# Patient Record
Sex: Female | Born: 1960 | Race: White | Hispanic: No | Marital: Married | Smoking: Former smoker
Health system: Southern US, Community
[De-identification: ages and names within clinical notes are randomized; demographics above are authoritative.]

## PROBLEM LIST (undated history)

## (undated) DIAGNOSIS — E039 Hypothyroidism, unspecified: Secondary | ICD-10-CM

## (undated) DIAGNOSIS — I10 Essential (primary) hypertension: Secondary | ICD-10-CM

## (undated) HISTORY — DX: Hypothyroidism, unspecified: E03.9

## (undated) HISTORY — DX: Essential (primary) hypertension: I10

---

## 1997-10-23 HISTORY — PX: OOPHORECTOMY: SHX86

## 1997-10-23 HISTORY — PX: CARPAL TUNNEL RELEASE: SHX101

## 2000-05-09 ENCOUNTER — Other Ambulatory Visit: Admission: RE | Admit: 2000-05-09 | Discharge: 2000-05-23 | Payer: Self-pay | Admitting: *Deleted

## 2000-07-16 ENCOUNTER — Ambulatory Visit (HOSPITAL_COMMUNITY): Admission: RE | Admit: 2000-07-16 | Discharge: 2000-07-16 | Payer: Self-pay | Admitting: Gastroenterology

## 2001-04-19 ENCOUNTER — Emergency Department (HOSPITAL_COMMUNITY): Admission: EM | Admit: 2001-04-19 | Discharge: 2001-04-19 | Payer: Self-pay | Admitting: Internal Medicine

## 2001-04-19 ENCOUNTER — Encounter: Payer: Self-pay | Admitting: Emergency Medicine

## 2001-04-19 ENCOUNTER — Emergency Department (HOSPITAL_COMMUNITY): Admission: EM | Admit: 2001-04-19 | Discharge: 2001-04-19 | Payer: Self-pay | Admitting: Emergency Medicine

## 2001-04-22 ENCOUNTER — Ambulatory Visit (HOSPITAL_COMMUNITY): Admission: AD | Admit: 2001-04-22 | Discharge: 2001-04-22 | Payer: Self-pay | Admitting: Urology

## 2001-04-22 ENCOUNTER — Encounter: Payer: Self-pay | Admitting: Urology

## 2005-09-18 ENCOUNTER — Other Ambulatory Visit: Admission: RE | Admit: 2005-09-18 | Discharge: 2005-09-18 | Payer: Self-pay | Admitting: Family Medicine

## 2008-01-15 ENCOUNTER — Other Ambulatory Visit: Admission: RE | Admit: 2008-01-15 | Discharge: 2008-01-15 | Payer: Self-pay | Admitting: Family Medicine

## 2010-07-31 ENCOUNTER — Ambulatory Visit: Payer: Self-pay | Admitting: Interventional Radiology

## 2010-07-31 ENCOUNTER — Observation Stay (HOSPITAL_COMMUNITY): Admission: EM | Admit: 2010-07-31 | Discharge: 2010-08-03 | Payer: Self-pay | Admitting: Internal Medicine

## 2010-08-01 ENCOUNTER — Encounter (INDEPENDENT_AMBULATORY_CARE_PROVIDER_SITE_OTHER): Payer: Self-pay | Admitting: Internal Medicine

## 2010-08-03 ENCOUNTER — Encounter: Payer: Self-pay | Admitting: Cardiology

## 2010-09-02 ENCOUNTER — Inpatient Hospital Stay (HOSPITAL_BASED_OUTPATIENT_CLINIC_OR_DEPARTMENT_OTHER): Admission: RE | Admit: 2010-09-02 | Discharge: 2010-09-02 | Payer: Self-pay | Admitting: Interventional Cardiology

## 2010-10-23 HISTORY — PX: FINGER SURGERY: SHX640

## 2010-10-28 LAB — BASIC METABOLIC PANEL
BUN: 18 mg/dL (ref 6–23)
CO2: 31 mEq/L (ref 19–32)
Calcium: 9.4 mg/dL (ref 8.4–10.5)
Chloride: 104 mEq/L (ref 96–112)
Creatinine, Ser: 1.22 mg/dL — ABNORMAL HIGH (ref 0.4–1.2)
GFR calc Af Amer: 56 mL/min — ABNORMAL LOW (ref >60)
GFR calc non Af Amer: 46 mL/min — ABNORMAL LOW (ref >60)
Glucose, Bld: 99 mg/dL (ref 70–99)
Potassium: 3.9 mEq/L (ref 3.5–5.1)
Sodium: 141 mEq/L (ref 135–145)

## 2010-10-31 ENCOUNTER — Ambulatory Visit
Admission: RE | Admit: 2010-10-31 | Discharge: 2010-10-31 | Payer: Self-pay | Source: Home / Self Care | Attending: Orthopedic Surgery | Admitting: Orthopedic Surgery

## 2010-11-07 LAB — POCT HEMOGLOBIN-HEMACUE: Hemoglobin: 14.9 g/dL (ref 12.0–15.0)

## 2010-11-11 NOTE — Op Note (Signed)
Jessica Valencia, Jessica Valencia               ACCOUNT NO.:  [id number]  MEDICAL RECORD NO.:  [id number]          PATIENT TYPE:  AMB  LOCATION:  DSC                          FACILITY:  [facility]  PHYSICIAN:  Betha Loa, MD        DATE OF BIRTH:  August 08, 1961  DATE OF PROCEDURE:  10/31/2010 DATE OF DISCHARGE:                              OPERATIVE REPORT   PREOPERATIVE DIAGNOSIS:  Left long locked trigger finger.  POSTOPERATIVE DIAGNOSIS:  Left long locked trigger finger.  PROCEDURE:  Left long finger trigger digit release.  SURGEON:  Betha Loa, MD  ASSISTANTS:  None.  ANESTHESIA:  MAC sedation with local anesthesia.  IV FLUIDS:  Per anesthesia flow sheet.  ESTIMATED BLOOD LOSS:  Minimal.  COMPLICATIONS:  None.  SPECIMENS:  None.  TOURNIQUET TIME:  70 minutes.  DISPOSITION:  Stable to PACU.  INDICATIONS:  Jessica Valencia is a 50 year old white female who I have followed in the office for bilateral long finger trigger digit.  We injected both trigger digits two times.  She returned to the office last week with her left long finger in a flexed position.  She states she is unable to extend it.  She had a painful and palpable flexor tendon nodule.  She wished to have a surgical release of the trigger digit. Risks, benefits, and alternatives of surgery were discussed including the risk of blood loss, infection, damage to nerves, vessels, tendons, ligaments, bone, failure of surgery, need for additional surgery, complications with wound healing, continued pain, and continued triggering.  She voiced understanding of risks and elected to proceed.  OPERATIVE COURSE:  After being identified preoperatively by myself, the patient and I agreed upon the procedure and site of procedure.  The surgical site was marked.  The risks, benefits, and alternatives of surgery were reviewed and she wished to proceed.  Surgical consent had been signed.  She was given 1 g of IV Ancef as a preoperative  antibiotic prophylaxis.  She was transported to the operating room and placed on the operating table in supine position with left upper extremity on armboard.  Sedation was induced by the anesthesia staff.  Local anesthesia was injected into the palm over the A1 pulley of the left long finger using 0.25% plain Marcaine, 8 mL total was used.  A surgical pause was performed between surgeons, Anesthesia, operating staff, and all were in agreement as to the patient, procedure, and site of procedure prior to the injection.  The long finger was then pulled into extension.  There was a sharp triggering.  It was difficult to extend. The left upper extremity was then prepped and draped in normal sterile orthopedic fashion using ChloraPrep.  Tourniquet at the proximal aspect of the extremity was inflated to 250 mmHg after exsanguination of the limb with an Esmarch bandage.  An incision was made over the A1 pulley of the left long finger between the proximal and distal palmar creases. This was carried into the subcutaneous tissues by spreading technique. Care was taken to protect all neurovascular structures.  The A1 pulley was identified.  It was sharply incised using the  knife.  There was noted to be a very large flexor tendon nodule.  The A1 pulley was incised in its entirety distally.  It was incised proximally.  There was a small amount of A0 pulley.  Some of the palmar fascia was released as well to prevent the nodule from triggering on that.  The finger was placed through full range of motion, it was not noted to trigger.  The patient was awoken from her sedation and the drape lowered so she could see her finger.  She placed her hand into a tight fist and then extended her fingers and noted no triggering.  I felt no triggering.  The drape was then re-raised.  The wound was copiously irrigated with 500 mL of sterile saline.  It was closed using 4-0 nylon in a horizontal mattress fashion.  The  wound was dressed with sterile Xeroform, 4 x 4's, and wrapped with Kling and an Ace bandage.  Tourniquet was deflated at 17 minutes.  The fingertips were pink with brisk capillary refill after deflation of the tourniquet.  The operative drapings were broken down. The patient was awoken from sedation safely.  She was transferred back to the stretcher and taken to the PACU in stable condition.  I will see her back in the office in 1 week for postoperative follow up.  I will give Percocet 5/325 one to two p.o. q.6 h. p.r.n. pain, dispensed #40.     Betha Loa, MD     KK/MEDQ  D:  10/31/2010  T:  10/31/2010  Job:  [id number]  Electronically Signed by Betha Loa  on 11/11/2010 03:59:22 PM

## 2011-01-04 LAB — CARDIAC PANEL(CRET KIN+CKTOT+MB+TROPI)
CK, MB: 1 ng/mL (ref 0.3–4.0)
CK, MB: 1.2 ng/mL (ref 0.3–4.0)
CK, MB: 1.3 ng/mL (ref 0.3–4.0)
Relative Index: INVALID (ref 0.0–2.5)
Relative Index: INVALID (ref 0.0–2.5)
Relative Index: INVALID (ref 0.0–2.5)
Total CK: 51 U/L (ref 7–177)
Total CK: 51 U/L (ref 7–177)
Total CK: 52 U/L (ref 7–177)
Troponin I: 0.01 ng/mL (ref 0.00–0.06)
Troponin I: 0.01 ng/mL (ref 0.00–0.06)
Troponin I: 0.01 ng/mL (ref 0.00–0.06)

## 2011-01-04 LAB — LIPID PANEL
Cholesterol: 191 mg/dL (ref 0–200)
HDL: 37 mg/dL — ABNORMAL LOW (ref >39)
LDL Cholesterol: 119 mg/dL — ABNORMAL HIGH (ref 0–99)
Total CHOL/HDL Ratio: 5.2 RATIO
Triglycerides: 176 mg/dL — ABNORMAL HIGH (ref <150)
VLDL: 35 mg/dL (ref 0–40)

## 2011-01-04 LAB — BASIC METABOLIC PANEL
BUN: 14 mg/dL (ref 6–23)
CO2: 26 mEq/L (ref 19–32)
Calcium: 8.7 mg/dL (ref 8.4–10.5)
Chloride: 108 mEq/L (ref 96–112)
Creatinine, Ser: 0.9 mg/dL (ref 0.4–1.2)
GFR calc Af Amer: >60 mL/min (ref >60)
GFR calc non Af Amer: >60 mL/min (ref >60)
Glucose, Bld: 101 mg/dL — ABNORMAL HIGH (ref 70–99)
Potassium: 3.2 mEq/L — ABNORMAL LOW (ref 3.5–5.1)
Sodium: 140 mEq/L (ref 135–145)

## 2011-01-04 LAB — HEMOGLOBIN A1C
Hgb A1c MFr Bld: 4.9 % (ref <5.7)
Mean Plasma Glucose: 94 mg/dL (ref <117)

## 2011-01-04 LAB — COMPREHENSIVE METABOLIC PANEL
ALT: 20 U/L (ref 0–35)
AST: 16 U/L (ref 0–37)
Albumin: 3.5 g/dL (ref 3.5–5.2)
Alkaline Phosphatase: 47 U/L (ref 39–117)
BUN: 20 mg/dL (ref 6–23)
CO2: 28 mEq/L (ref 19–32)
Calcium: 9.7 mg/dL (ref 8.4–10.5)
Chloride: 108 mEq/L (ref 96–112)
Creatinine, Ser: 0.92 mg/dL (ref 0.4–1.2)
GFR calc Af Amer: >60 mL/min (ref >60)
GFR calc non Af Amer: >60 mL/min (ref >60)
Glucose, Bld: 109 mg/dL — ABNORMAL HIGH (ref 70–99)
Potassium: 4.1 mEq/L (ref 3.5–5.1)
Sodium: 141 mEq/L (ref 135–145)
Total Bilirubin: 0.4 mg/dL (ref 0.3–1.2)
Total Protein: 6.1 g/dL (ref 6.0–8.3)

## 2011-01-04 LAB — CBC
HCT: 38.8 % (ref 36.0–46.0)
HCT: 40 % (ref 36.0–46.0)
Hemoglobin: 13.5 g/dL (ref 12.0–15.0)
Hemoglobin: 14 g/dL (ref 12.0–15.0)
MCH: 30.1 pg (ref 26.0–34.0)
MCH: 30.3 pg (ref 26.0–34.0)
MCHC: 34.8 g/dL (ref 30.0–36.0)
MCHC: 35 g/dL (ref 30.0–36.0)
MCV: 86.1 fL (ref 78.0–100.0)
MCV: 87.1 fL (ref 78.0–100.0)
Platelets: 196 10*3/uL (ref 150–400)
Platelets: 201 10*3/uL (ref 150–400)
RBC: 4.45 MIL/uL (ref 3.87–5.11)
RBC: 4.64 MIL/uL (ref 3.87–5.11)
RDW: 14.1 % (ref 11.5–15.5)
RDW: 14.3 % (ref 11.5–15.5)
WBC: 6.3 10*3/uL (ref 4.0–10.5)
WBC: 6.4 10*3/uL (ref 4.0–10.5)

## 2011-01-04 LAB — TSH: TSH: 0.665 u[IU]/mL (ref 0.350–4.500)

## 2011-01-04 LAB — D-DIMER, QUANTITATIVE: D-Dimer, Quant: <0.22 ug/mL-FEU (ref 0.00–0.48)

## 2011-01-05 LAB — POCT CARDIAC MARKERS
CKMB, poc: <1 ng/mL — ABNORMAL LOW (ref 1.0–8.0)
CKMB, poc: <1 ng/mL — ABNORMAL LOW (ref 1.0–8.0)
Myoglobin, poc: 43.2 ng/mL (ref 12–200)
Myoglobin, poc: 67.1 ng/mL (ref 12–200)
Troponin i, poc: <0.05 ng/mL (ref 0.00–0.09)
Troponin i, poc: <0.05 ng/mL (ref 0.00–0.09)

## 2011-01-05 LAB — CBC
HCT: 45.9 % (ref 36.0–46.0)
Hemoglobin: 15.5 g/dL — ABNORMAL HIGH (ref 12.0–15.0)
MCH: 29.8 pg (ref 26.0–34.0)
MCHC: 33.8 g/dL (ref 30.0–36.0)
MCV: 88.4 fL (ref 78.0–100.0)
Platelets: 225 10*3/uL (ref 150–400)
RBC: 5.2 MIL/uL — ABNORMAL HIGH (ref 3.87–5.11)
RDW: 13.2 % (ref 11.5–15.5)
WBC: 7.8 10*3/uL (ref 4.0–10.5)

## 2011-01-05 LAB — DIFFERENTIAL
Basophils Absolute: 0.1 10*3/uL (ref 0.0–0.1)
Basophils Relative: 1 % (ref 0–1)
Eosinophils Absolute: 0.3 10*3/uL (ref 0.0–0.7)
Eosinophils Relative: 4 % (ref 0–5)
Lymphocytes Relative: 29 % (ref 12–46)
Lymphs Abs: 2.2 10*3/uL (ref 0.7–4.0)
Monocytes Absolute: 0.5 10*3/uL (ref 0.1–1.0)
Monocytes Relative: 7 % (ref 3–12)
Neutro Abs: 4.7 10*3/uL (ref 1.7–7.7)
Neutrophils Relative %: 60 % (ref 43–77)

## 2011-01-05 LAB — BASIC METABOLIC PANEL
BUN: 15 mg/dL (ref 6–23)
CO2: 30 mEq/L (ref 19–32)
Calcium: 9.5 mg/dL (ref 8.4–10.5)
Chloride: 105 mEq/L (ref 96–112)
Creatinine, Ser: 0.8 mg/dL (ref 0.4–1.2)
GFR calc Af Amer: >60 mL/min (ref >60)
GFR calc non Af Amer: >60 mL/min (ref >60)
Glucose, Bld: 83 mg/dL (ref 70–99)
Potassium: 3.2 mEq/L — ABNORMAL LOW (ref 3.5–5.1)
Sodium: 142 mEq/L (ref 135–145)

## 2011-03-10 NOTE — Op Note (Signed)
[facility]  Patient:    TAMEIA, RAFFERTY                      MRN: [id number] Proc. Date: 04/22/01 Adm. Date:  [id number] Attending:  Evlyn Clines CC:         Abigail Butts, M.D., [facility]                           Operative Report  PREOPERATIVE DIAGNOSIS:  Left proximal ureteral stone.  POSTOPERATIVE DIAGNOSIS:  Left proximal ureteral stone.  PROCEDURE PERFORMED: 1. Cystoscopy. 2. Left retrograde pyelogram with interpretation. 3. Basket extraction of left ureteral stone. 4. Insertion of left JJ stent.  SURGEON:  Excell Seltzer. Annabell Howells, M.D.  ANESTHESIA:  General.  DRAINS:  Left 6 French x 24 cm JJ stent.  COMPLICATIONS:  None.  SPECIMENS:  Stone.  INDICATIONS:  Ms. Maxham is a 50 year old white female with a 3-4 mm left proximal ureteral stone, who has had pain despite analgesics since being seen in the emergency room on Friday or Saturday.  She has elected to proceed with ureteroscopic stone extraction.  DESCRIPTION OF PROCEDURE:  The patient was given p.o. Tequin.  She was taken to the operating room where a general anesthetic was induced.  She was placed in the lithotomy position.  Her perineum and genitalia were prepped with Betadine solution and she was draped in the usual sterile fashion.  Cystoscopy was performed using the 22 [place] scope and a 12 degree lens.  Examination revealed a normal urethra.  The bladder wall was unremarkable.  The ureteral orifices were in their normal anatomic position.  The left ureteral orifice was cannulated with a 5 [place] open-ended catheter.  Contrast was instilled in a retrograde fashion.  This demonstrated a small filling defect in the proximal ureter about L3 consistent with a stone noted on prior films.  The 5 [place] open-ended catheter was then removed and a Pfister-Schwartz 11 mm stone basket with a filiform tip was passed by the stone, the basket was opened, the stone was engaged,  and was removed without difficulty.  There was a little bit of mucosal tear at the ureteral meatus and some bleeding.  A repeat retrograde following the procedure revealed a little bit of filling defect at the prior site of the stone likely from inflammation, but there was no extravasation or other evidence of ureteral tear.  At this point, a guidewire was passed to the kidney, and a 6 [place], 24 cm JJ stent was passed without difficulty under fluoroscopic guidance.  The guidewire was removed leaving a good coil in the kidney and a good coil in the bladder.  The cystoscope was removed after draining the bladder, leaving the stent string exiting from the urethra.  The string was tied and cut short to an appropriate length.  The patient was taken down from the lithotomy position.  her anesthetic was reversed and she was moved to the recovery room in stable condition.  There were no complications at the end of the procedure. D:  04/22/01 TD:  04/23/01 Job: [id number]

## 2011-03-10 NOTE — Procedures (Signed)
[facility]  Patient:    Jessica Valencia, Jessica Valencia                      MRN: [id number] Proc. Date: 07/16/00 Adm. Date:  [id number] Attending:  Nelda Marseille CC:         Marinda Elk, M.D.   Procedure Report  PROCEDURE:  Colonoscopy.  INDICATIONS:  Family history of colon polyps, guaiac positivity.  INFORMED CONSENT:  Consent was signed after risk, benefits, methods and options were thoroughly discussed in the office.  MEDICINES USED:  Demerol 70 mg, Versed 5 mg.  DESCRIPTION OF PROCEDURE:  Rectal inspection is pertinent for external hemorrhoids.  Digital examination was negative.  The pediatric video colonoscope was inserted, fairly easily advanced around the ______ into the the cecum.  This did require rolling her on her back and some abdominal pressure.  The cecum was identified by the appendiceal orifice and the ileocecal valve.  In fact the scope was inserted a short ways in the terminal ileum which was normal.  Further augmentation was obtained.  The scope was slowly withdrawn.  The prep was adequate.  There was some liquid stool and that required washing and suctioning on the right side, stool that was adherent to the wall, but no obvious polypoid lesions, masses, or other abnormalities.  There was no sign of bleeding as we slowly withdrew back to the rectum.  Once back in the rectum the scope was in retroflex pertinent for some internal hemorrhoids.  The scope was straightened, readvanced a short ways up the sigmoid.  Air was suction.  The scope removed.  The patient tolerated the procedure well and there was no obvious immediate complications.   ENDOSCOPIC DIAGNOSIS: 1. Internal and external hemorrhoids. 2. Otherwise within normal limits to the terminal ileum.  PLAN:  Yearly rectals and guaiacs per Dr. Foy Guadalajara.  Consideration repeat screening p.r.n. or at age 69, and continue work-up for now for an EGD. Please see that dictation for  details. DD:  07/16/00 TD:  07/17/00 Job: [id number]

## 2011-03-10 NOTE — Procedures (Signed)
[facility]  Patient:    Jessica Valencia, Jessica Valencia                      MRN: [id number] Proc. Date: 07/16/00 Adm. Date:  [id number] Attending:  Nelda Marseille CC:         Ranae Plumber, M.D.   Procedure Report  PROCEDURE:  Esophagogastroduodenoscopy.  INDICATIONS FOR PROCEDURE:  A patient with guaiac positivity, nondiagnostic colonoscopy, upper tract symptoms.  Consent was signed after risks, benefits, methods, and options were thoroughly discussed in the office and before any premeds given.  ADDITIONAL MEDICINES FOR THIS PROCEDURE:  Demerol 10, Versed 1.  DESCRIPTION OF PROCEDURE:  The video endoscope was inserted by direct vision. The esophagus was normal. In the distal esophagus, a small hiatal hernia was seen. The scope was inserted into the stomach and advanced through a normal pylorus into a normal duodenal bulb and around the C loop to a normal second portion of the duodenum. The scope was withdrawn back to the bulb and good look there ruled out ulcers in that location. The scope was withdrawn back to the stomach and retroflexed. High in the cardia, the hiatal hernia was confirmed. The fundus, angularis, lesser and greater curve were normal on retroflexed visualization. The scope was straightened. A good look at the antrum, lesser and greater curve on slow withdrawal was pertinent for some minimal gastritis and mild antritis but no other abnormalities. Air was suctioned, the scope slowly withdrawn. Again, a good look at the esophagus was normal without signs of Barretts or significant esophagitis. The scope was removed. The patient tolerated the procedure well. There was no obvious or immediate complication.  ENDOSCOPIC DIAGNOSIS: 1. Small hiatal hernia. 2. Minimal gastritis and antritis. 3. Otherwise normal esophagogastroduodenoscopy.  PLAN:  Continue pump inhibitors. Follow-up p.r.n. or in 6 weeks to recheck symptoms, guaiacs and make sure no  further workup plans are needed. DD:  07/16/00 TD:  07/17/00 Job: [id number]

## 2013-03-14 ENCOUNTER — Other Ambulatory Visit: Payer: Self-pay

## 2013-03-14 DIAGNOSIS — Z1231 Encounter for screening mammogram for malignant neoplasm of breast: Secondary | ICD-10-CM

## 2013-08-07 ENCOUNTER — Ambulatory Visit
Admission: RE | Admit: 2013-08-07 | Discharge: 2013-08-07 | Disposition: A | Discharge status: Home / Self Care | Payer: Self-pay | Source: Ambulatory Visit

## 2013-08-07 DIAGNOSIS — Z1231 Encounter for screening mammogram for malignant neoplasm of breast: Secondary | ICD-10-CM

## 2014-08-12 ENCOUNTER — Other Ambulatory Visit: Payer: Self-pay | Admitting: Family Medicine

## 2014-08-12 DIAGNOSIS — Z1239 Encounter for other screening for malignant neoplasm of breast: Secondary | ICD-10-CM

## 2014-08-14 ENCOUNTER — Ambulatory Visit (HOSPITAL_BASED_OUTPATIENT_CLINIC_OR_DEPARTMENT_OTHER)
Admission: RE | Admit: 2014-08-14 | Discharge: 2014-08-14 | Disposition: A | Discharge status: Home / Self Care | Payer: 59 | Source: Ambulatory Visit | Attending: Family Medicine | Admitting: Family Medicine

## 2014-08-14 DIAGNOSIS — Z1239 Encounter for other screening for malignant neoplasm of breast: Secondary | ICD-10-CM | POA: Insufficient documentation

## 2015-07-14 ENCOUNTER — Other Ambulatory Visit: Payer: Self-pay | Admitting: Family Medicine

## 2015-07-14 ENCOUNTER — Other Ambulatory Visit (HOSPITAL_BASED_OUTPATIENT_CLINIC_OR_DEPARTMENT_OTHER): Payer: Self-pay | Admitting: Family Medicine

## 2015-07-14 DIAGNOSIS — Z1231 Encounter for screening mammogram for malignant neoplasm of breast: Secondary | ICD-10-CM

## 2015-08-19 ENCOUNTER — Ambulatory Visit: Payer: 59

## 2015-08-27 ENCOUNTER — Ambulatory Visit (HOSPITAL_BASED_OUTPATIENT_CLINIC_OR_DEPARTMENT_OTHER): Payer: 59

## 2016-10-27 ENCOUNTER — Other Ambulatory Visit (HOSPITAL_BASED_OUTPATIENT_CLINIC_OR_DEPARTMENT_OTHER): Payer: Self-pay | Admitting: Physician Assistant

## 2016-10-27 DIAGNOSIS — Z1231 Encounter for screening mammogram for malignant neoplasm of breast: Secondary | ICD-10-CM

## 2016-11-03 ENCOUNTER — Ambulatory Visit (HOSPITAL_BASED_OUTPATIENT_CLINIC_OR_DEPARTMENT_OTHER)
Admission: RE | Admit: 2016-11-03 | Discharge: 2016-11-03 | Disposition: A | Discharge status: Home / Self Care | Payer: 59 | Source: Ambulatory Visit | Attending: Physician Assistant | Admitting: Physician Assistant

## 2016-11-03 DIAGNOSIS — Z1231 Encounter for screening mammogram for malignant neoplasm of breast: Secondary | ICD-10-CM | POA: Diagnosis not present

## 2016-12-28 DIAGNOSIS — N393 Stress incontinence (female) (male): Secondary | ICD-10-CM | POA: Insufficient documentation

## 2017-09-04 ENCOUNTER — Ambulatory Visit (INDEPENDENT_AMBULATORY_CARE_PROVIDER_SITE_OTHER): Payer: 59 | Admitting: Podiatry

## 2017-09-04 ENCOUNTER — Ambulatory Visit (INDEPENDENT_AMBULATORY_CARE_PROVIDER_SITE_OTHER): Payer: 59

## 2017-09-04 ENCOUNTER — Telehealth: Payer: Self-pay | Admitting: *Deleted

## 2017-09-04 ENCOUNTER — Encounter: Payer: Self-pay | Admitting: Podiatry

## 2017-09-04 VITALS — BP 143/90 | HR 86 | Resp 16

## 2017-09-04 DIAGNOSIS — M779 Enthesopathy, unspecified: Secondary | ICD-10-CM

## 2017-09-04 DIAGNOSIS — M7751 Other enthesopathy of right foot: Secondary | ICD-10-CM

## 2017-09-04 DIAGNOSIS — M792 Neuralgia and neuritis, unspecified: Secondary | ICD-10-CM

## 2017-09-04 DIAGNOSIS — G5791 Unspecified mononeuropathy of right lower limb: Secondary | ICD-10-CM | POA: Diagnosis not present

## 2017-09-04 DIAGNOSIS — M778 Other enthesopathies, not elsewhere classified: Secondary | ICD-10-CM

## 2017-09-04 NOTE — Progress Notes (Signed)
Subjective:  Patient ID: Jessica Valencia, female    DOB: 06/09/1961,  MRN: [id number] HPI Chief Complaint  Patient presents with  . Foot Pain    Dorsal/lateral right - patient states she had a work injury where a coworker dropped a roll of fabric on her foot 2 years ago, dx with severe bone bruise, was put in boot for several weeks, 6 weeks ago started hurting again, swelling, uses heating pad, takes meloxicam or ibuprofen    56 y.o. female presents with the above complaint.   She states that she can barely stand anything to touch this and the throbbing pain is getting to be more than she can stand.  Past Medical History:  Diagnosis Date  . HTN (hypertension)   . Hypothyroid    Past Surgical History:  Procedure Laterality Date  . CARPAL TUNNEL RELEASE  1999   Right Carpal Tunnel   . FINGER SURGERY  10/2010   Left trigger finger surgery Dr Betha LoaKevin Kuzma   . OOPHORECTOMY  1999   Right Ovary Removed     Current Outpatient Medications:  .  meloxicam (MOBIC) 7.5 MG tablet, Take 7.5 mg daily by mouth., Disp: , Rfl:  .  ALPRAZolam (XANAX) 0.25 MG tablet, Take 0.25 mg by mouth at bedtime as needed for anxiety. Xanax 0.25 MG Tablet 1 tablet daily as needed, Disp: , Rfl:  .  B Complex Vitamins (B COMPLEX PO), Take by mouth., Disp: , Rfl:  .  DOCOSAHEXAENOIC ACID PO, Take by mouth., Disp: , Rfl:  .  eszopiclone (LUNESTA) 2 MG TABS tablet, , Disp: , Rfl: 0 .  FLUARIX QUADRIVALENT 0.5 ML injection, , Disp: , Rfl: 0 .  hydrochlorothiazide (MICROZIDE) 12.5 MG capsule, Take 12.5 mg by mouth daily. Hydrochlorothiazide 12.5 MG Capsule TAKE ONE CAPSULE BY MOUTH EVERY DAY, Disp: , Rfl:  .  ibuprofen (ADVIL,MOTRIN) 800 MG tablet, , Disp: , Rfl: 1 .  levothyroxine (SYNTHROID, LEVOTHROID) 112 MCG tablet, Take 112 mcg by mouth as directed. Levothyroxine Sodium 112 MCG Tablet TAKE 1 TABLET BY MOUTH EVERY DAY 6 Days a week. None on Sunday, Disp: , Rfl:  .  lisinopril (PRINIVIL,ZESTRIL) 10 MG tablet, Take 10  mg by mouth daily. Lisinopril 10 MG Tablet TAKE 1 TABLET BY MOUTH EVERY DAY, Disp: , Rfl:  .  omeprazole (PRILOSEC) 40 MG capsule, Take 40 mg by mouth daily. Omeprazole 40 MG Capsule Delayed Release 1 capsule Once a day, Disp: , Rfl:  .  ondansetron (ZOFRAN) 4 MG tablet, Take 4 mg by mouth every 8 (eight) hours as needed for nausea or vomiting. Zofran 4 MG Tablet 1 tablets three times a day as needed for nausea, Disp: , Rfl:  .  TRAZODONE HCL PO, Take by mouth. Trazodone HCl 50 MG Tablet 1-2 tablets at bedtime, Disp: , Rfl:   Allergies  Allergen Reactions  . Phenergan [Promethazine Hcl]     Phenergan 25 mg tabs: loopy: Side Effects   Review of Systems  Musculoskeletal: Positive for gait problem.  Psychiatric/Behavioral: The patient is nervous/anxious.   All other systems reviewed and are negative.  Objective:   Vitals:   09/04/17 0947  BP: (!) 143/90  Pulse: 86  Resp: 16    General: Well developed, nourished, in no acute distress, alert and oriented x3   Dermatological: Skin is warm, dry and supple bilateral. Nails x 10 are well maintained; remaining integument appears unremarkable at this time. There are no open sores, no preulcerative lesions, no rash or  signs of infection present.  Vascular: Dorsalis Pedis artery and Posterior Tibial artery pedal pulses are 2/4 bilateral with immedate capillary fill time. Pedal hair growth present. No varicosities and no lower extremity edema present bilateral.   Neruologic: Grossly intact via light touch bilateral. Vibratory intact via tuning fork bilateral. Protective threshold with Semmes Wienstein monofilament intact to all pedal sites bilateral. Patellar and Achilles deep tendon reflexes 2+ bilateral. No Babinski or clonus noted bilateral.   Musculoskeletal: No gross boney pedal deformities bilateral. No pain, crepitus, or limitation noted with foot and ankle range of motion bilateral. Muscular strength 5/5 in all groups tested bilateral. She  has pain on palpation overlying the dorsal lateral aspect of the right foot particularly extensor digitorum brevis area. She has pain on range of motion of all of the lesser metatarsals right foot. Abduction and plantar flexion with abduction is very painful but the tendons do appear to be intact. There is not a lot of edema no ecchymosis. No signs of infection.  Gait: Antalgic gait right   Radiographs:  Radiographs taken today 3 views right foot demonstrates an osseously mature foot no fractures are identified there does appear to be in appearance at the base of the third and fourth metatarsals of the right foot though this does not appear to be a fracture currently nor does it appear to have been a fracture.  Assessment & Plan:   Assessment: Cannot rule out a stress injury to the right foot overlying the dorsal lateral midfoot. She also has symptoms of neuritis.  Plan: Since this has been a recurrent problem and symptoms are not relieved with typical anti-inflammatories and therapies MRI will be necessary. I will place her in a compression anklet which she states does feel much better which lends to more of a neurological diagnosis. Requesting MRI as soon as possible. I also started her on methylprednisolone. She will notify me if this works and we will cancel MRI.     Max T. DubachHyatt, [place] Dakota[place]PM

## 2017-09-04 NOTE — Telephone Encounter (Signed)
-----   Message from Kristian Coveyshley E Prevette, [facility] sent at 09/04/2017  1:42 PM EST ----- Regarding: MRI MRI foot and rearfoot right - trauma 2 years ago with long term treatment, possible lisfranc's injury (midfoot pain) right - surgical consideration

## 2017-09-04 NOTE — Telephone Encounter (Signed)
Orders to [facility] for pre-cert and faxed to  Imaging. 

## 2017-09-05 ENCOUNTER — Telehealth: Payer: Self-pay | Admitting: Podiatry

## 2017-09-05 NOTE — Telephone Encounter (Signed)
I was in to see Dr. Al CorpusHyatt yesterday and he was supposed to send a six day steroid prescription to the [facility] in [place]. As of last night, they had not received anything. If you could please help me out with that.

## 2017-09-06 ENCOUNTER — Other Ambulatory Visit: Payer: Self-pay | Admitting: Podiatry

## 2017-09-06 MED ORDER — METHYLPREDNISOLONE 4 MG PO TBPK: ORAL_TABLET | ORAL | 0 refills | Status: DC

## 2017-09-06 NOTE — Telephone Encounter (Signed)
Inform patient that I sent it over.

## 2017-09-06 NOTE — Telephone Encounter (Signed)
My name is Trenda MootsBeverly Terpstra and I was in to see Dr. Al CorpusHyatt yesterday. He was supposedly going to send me a prescription to the [facility] in [place] for steroids. They claim they don't have them yet. My number is [phone]. Thanks.

## 2017-09-06 NOTE — Telephone Encounter (Signed)
Yes, my name is Jessica Valencia and I have made two calls to you and nobody has returned either call. If you can have someone return my call at [phone] I would really appreciate that. Thank you.

## 2017-09-16 ENCOUNTER — Ambulatory Visit
Admission: RE | Admit: 2017-09-16 | Discharge: 2017-09-16 | Disposition: A | Discharge status: Home / Self Care | Payer: 59 | Source: Ambulatory Visit | Attending: Podiatry | Admitting: Podiatry

## 2017-09-16 DIAGNOSIS — M792 Neuralgia and neuritis, unspecified: Secondary | ICD-10-CM

## 2017-09-16 DIAGNOSIS — M779 Enthesopathy, unspecified: Secondary | ICD-10-CM

## 2017-09-27 ENCOUNTER — Encounter: Payer: Self-pay | Admitting: Podiatry

## 2017-09-27 ENCOUNTER — Ambulatory Visit (INDEPENDENT_AMBULATORY_CARE_PROVIDER_SITE_OTHER): Payer: 59 | Admitting: Podiatry

## 2017-09-27 DIAGNOSIS — M778 Other enthesopathies, not elsewhere classified: Secondary | ICD-10-CM

## 2017-09-27 DIAGNOSIS — M7751 Other enthesopathy of right foot: Secondary | ICD-10-CM

## 2017-09-27 DIAGNOSIS — G5791 Unspecified mononeuropathy of right lower limb: Secondary | ICD-10-CM

## 2017-09-27 DIAGNOSIS — M779 Enthesopathy, unspecified: Secondary | ICD-10-CM

## 2017-09-27 NOTE — Progress Notes (Signed)
She presents today for follow-up of her MRI stating that her right foot is still just as painful as it was before.  Objective: Vital signs are stable she is alert and oriented x3.  Pulses are palpable.  She has pain with palpation to Lisfranc's joints of the right foot.  She has pain on frontal plane range of motion.  MRI does demonstrate osteoarthritis of the midfoot as well as posterior tibial tendinitis which is asymptomatic on her.  Assessment: Osteoarthritis Lisfranc's joints right foot.  Capsulitis right foot.  Plan: Discussed etiology pathology conservative versus surgical therapies.  After thorough discussion and consent and sterile Betadine skin prep we injected her across the dorsal aspect of the foot a total of 40 mg of Kenalog was utilized with 10 mg of Marcaine.  I will follow-up with her in 6 weeks if necessary.

## 2018-01-04 ENCOUNTER — Other Ambulatory Visit (HOSPITAL_BASED_OUTPATIENT_CLINIC_OR_DEPARTMENT_OTHER): Payer: Self-pay | Admitting: Internal Medicine

## 2018-01-04 DIAGNOSIS — Z1231 Encounter for screening mammogram for malignant neoplasm of breast: Secondary | ICD-10-CM

## 2018-01-05 ENCOUNTER — Encounter (HOSPITAL_BASED_OUTPATIENT_CLINIC_OR_DEPARTMENT_OTHER): Payer: Self-pay

## 2018-01-05 ENCOUNTER — Ambulatory Visit (HOSPITAL_BASED_OUTPATIENT_CLINIC_OR_DEPARTMENT_OTHER)
Admission: RE | Admit: 2018-01-05 | Discharge: 2018-01-05 | Disposition: A | Discharge status: Home / Self Care | Payer: 59 | Source: Ambulatory Visit | Attending: Internal Medicine | Admitting: Internal Medicine

## 2018-01-05 DIAGNOSIS — Z1231 Encounter for screening mammogram for malignant neoplasm of breast: Secondary | ICD-10-CM | POA: Diagnosis present

## 2018-06-07 ENCOUNTER — Telehealth: Payer: Self-pay | Admitting: Podiatry

## 2018-06-07 NOTE — Telephone Encounter (Signed)
I saw Dr. Al CorpusHyatt on 04 September 2017. I need to know the name of the shot that I was given in my foot. If you could return my call I would really appreciate it. My number is [phone]. Thank you.

## 2018-06-10 NOTE — Telephone Encounter (Signed)
Left message informing pt she received an injection on 09/27/2018 of kenalog a antiinflammatory steroid and marcaine a numbing medication.

## 2018-06-27 ENCOUNTER — Other Ambulatory Visit (HOSPITAL_BASED_OUTPATIENT_CLINIC_OR_DEPARTMENT_OTHER): Payer: Self-pay | Admitting: Adult Health Nurse Practitioner

## 2018-06-27 DIAGNOSIS — R55 Syncope and collapse: Secondary | ICD-10-CM

## 2018-08-26 IMAGING — MR MR FOOT*R* W/O CM
4 of 6 series · 19 of 40 positions shown · non-contrast
Comparison: None.

CLINICAL DATA: Right foot pain and swelling for 2 years. The
patient dropped a roll of fabric on her the right foot 2 years ago.
Subsequent encounter.

EXAM:
MRI OF THE RIGHT FOREFOOT WITHOUT CONTRAST
TECHNIQUE: Multiplanar, multisequence MR imaging of the plain films right foot
10/24/2013. Was performed. No intravenous contrast was administered.

[Series 4: T2 fat-sat · coronal · 4.0mm · 0.23mm/px · 7 of 34 slices shown (1 of 3)]
[im 1/34]
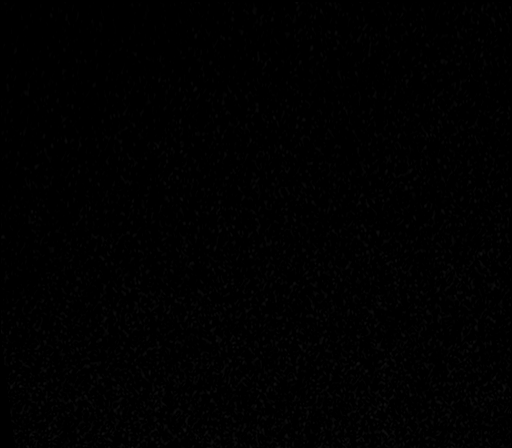
[im 6/34]
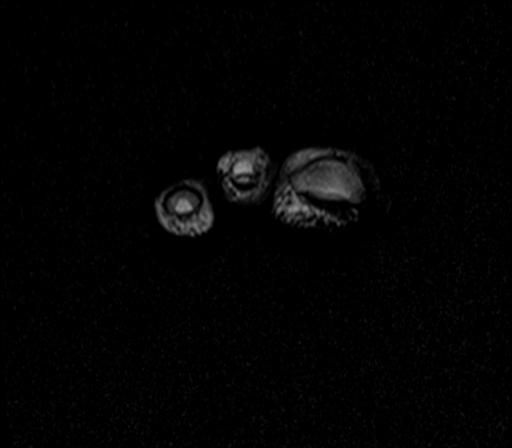
[im 12/34]
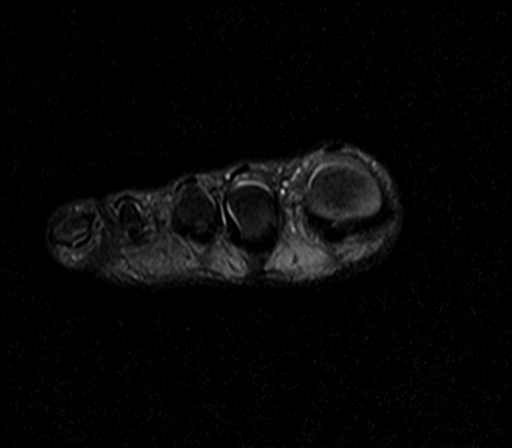
[im 17/34]
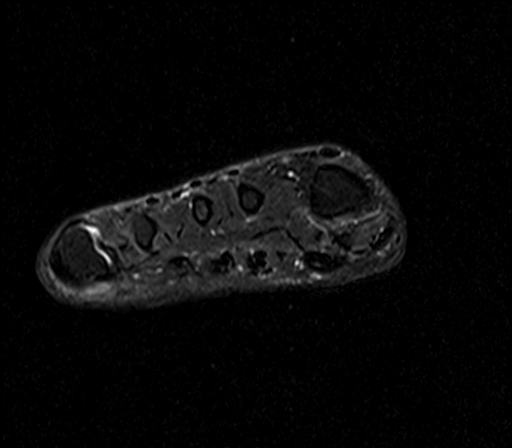
[im 23/34]
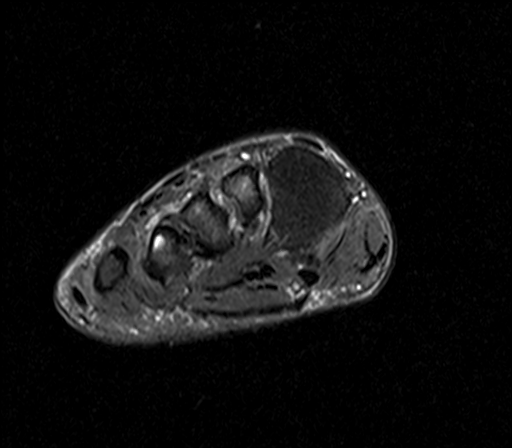
[im 28/34]
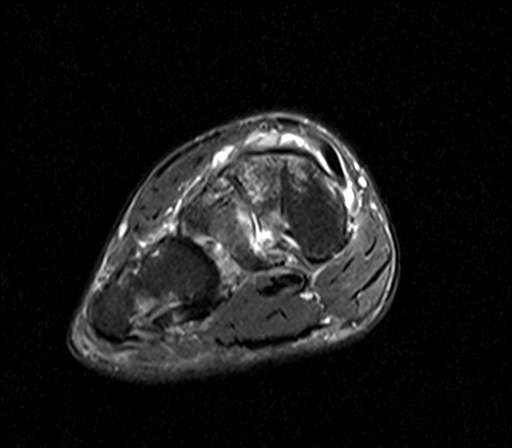
[im 34/34]
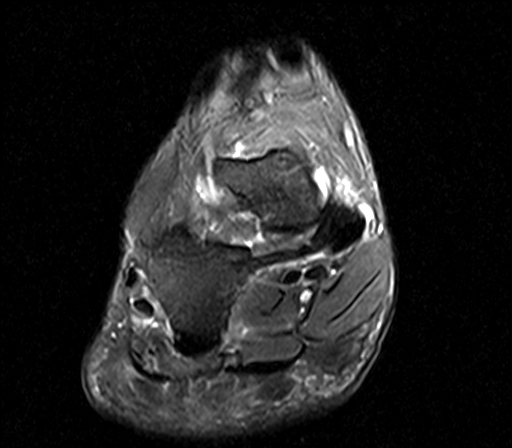

[Series 5: T2 fat-sat · sagittal · 3.0mm · 0.28mm/px · 6 of 30 slices shown (2 of 3)]
[im 1/30]
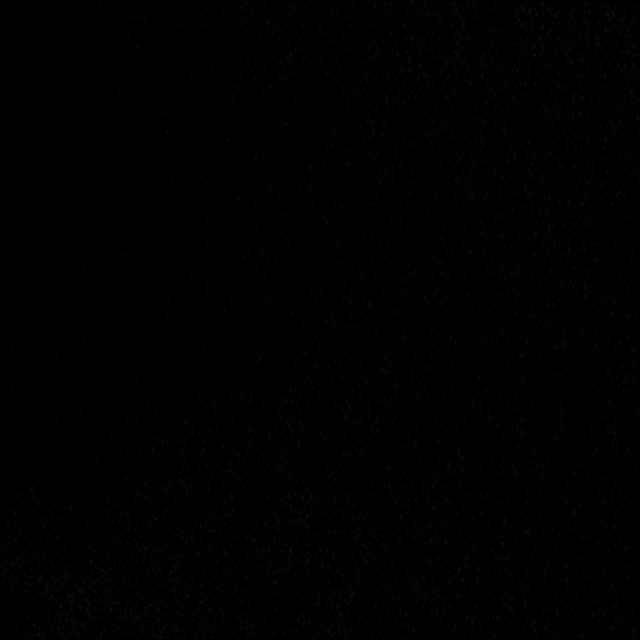
[im 5/30]
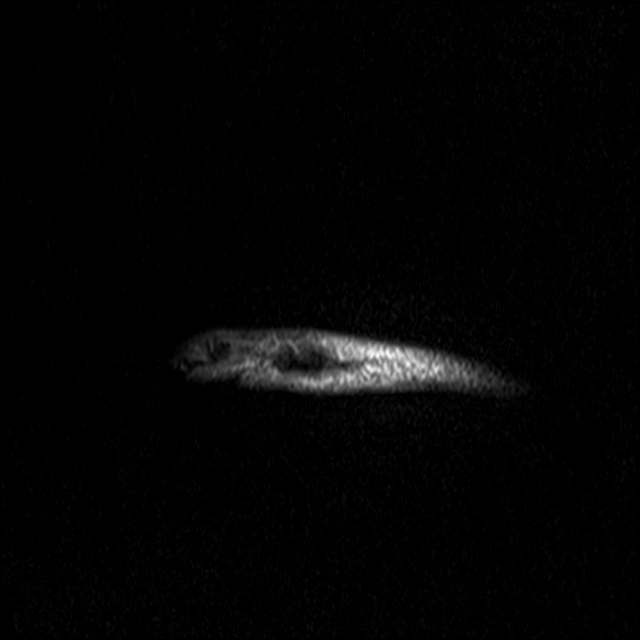
[im 10/30]
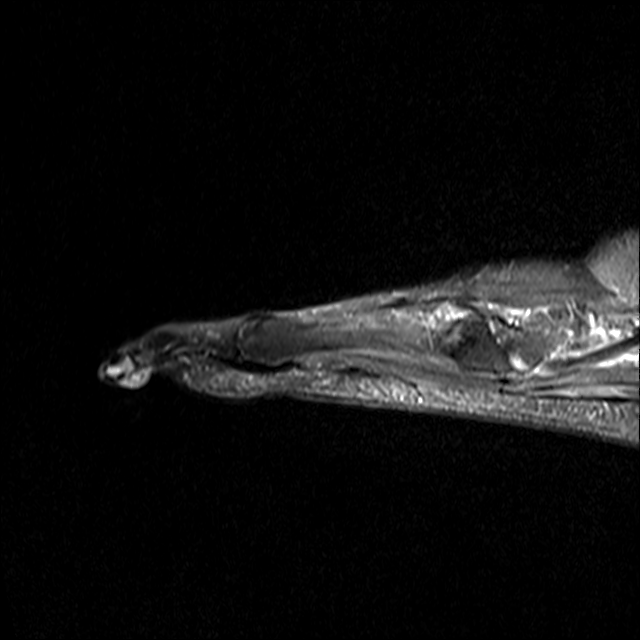
[im 15/30]
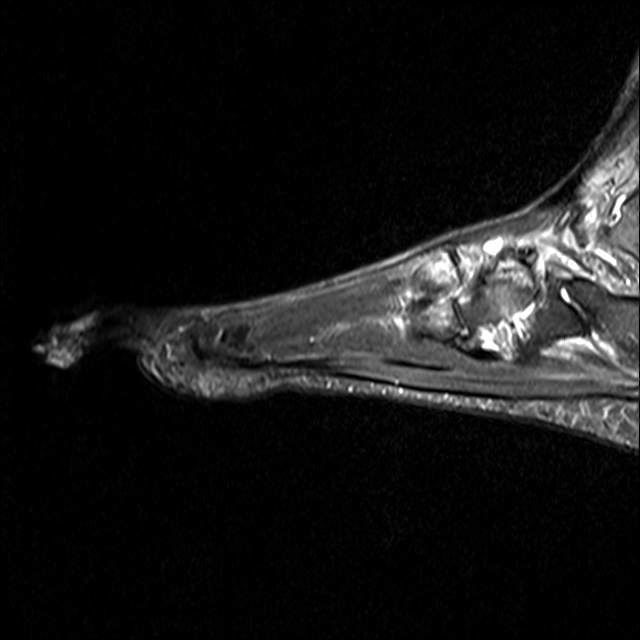
[im 20/30]
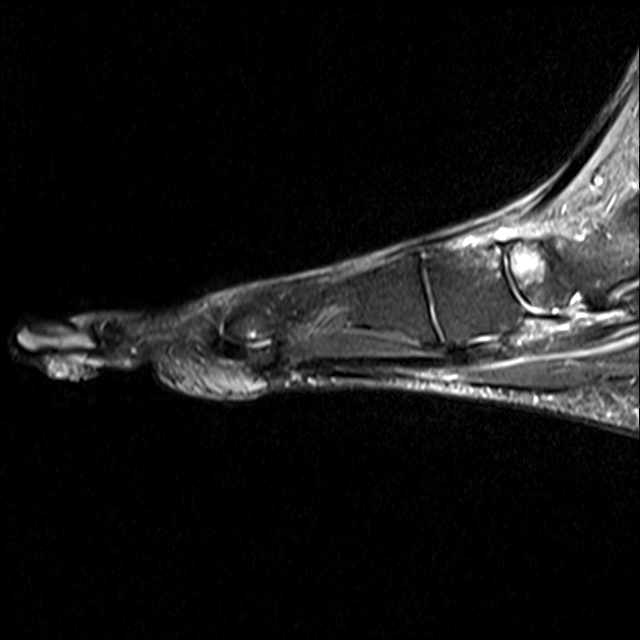
[im 25/30]
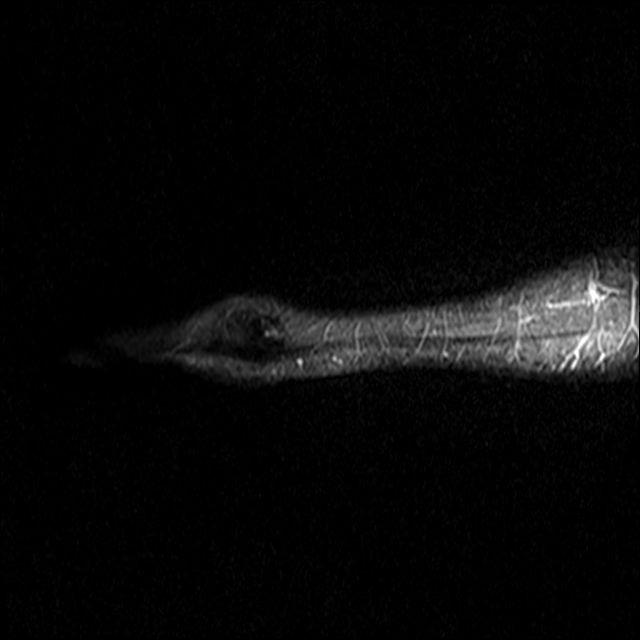

[Series 6: T1 · coronal · 4.0mm · 0.38mm/px · 3 of 34 slices shown]
[im 5/34]
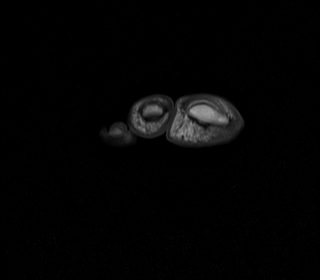
[im 19/34]
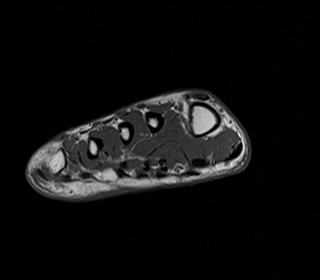
[im 29/34]
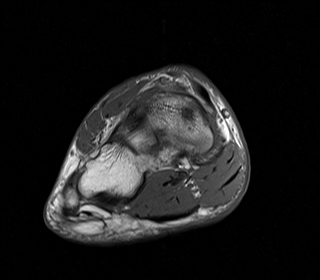

[Series 8: T2 fat-sat · axial · 3.0mm · 0.35mm/px · z∈[-132,-72]mm · 3 of 26 slices shown (3 of 3)]
[im 6/26]
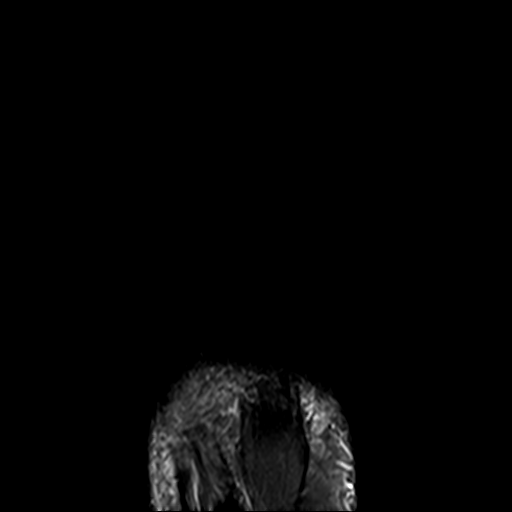
[im 16/26]
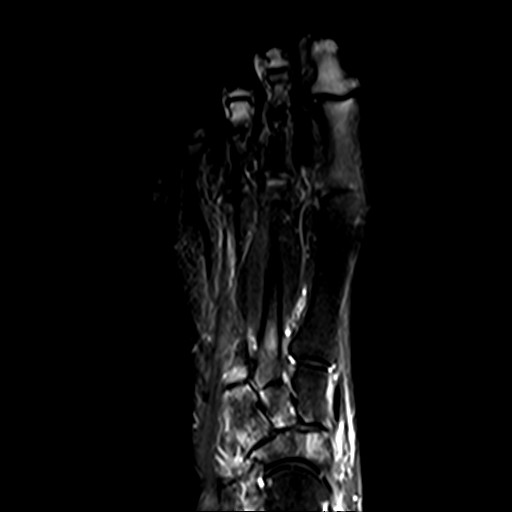
[im 26/26]
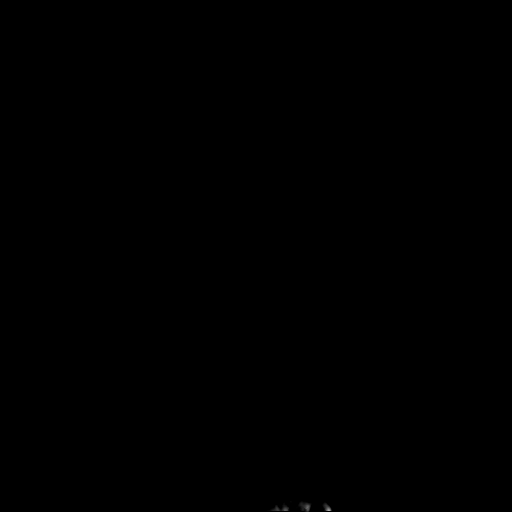

[19 of 40 positions shown; findings below may reference images not displayed]

FINDINGS: Bones/Joint/Cartilage

Subchondral edema is present about the second, third and fourth
tarsometatarsal joints and articulation of the navicular and
cuneiforms, worst about the middle and lateral cuneiforms. There is
also some subchondral cyst formation about the third and fourth
tarsometatarsal joints. Marrow edema extending into the proximal
diaphyses of the second, third and fourth metatarsals is compatible
with stress change. No fracture is identified.

Ligaments

Intact.  The Lisfranc ligament is intact.

Muscles and Tendons

Intact and normal appearance.  No atrophy or focal lesion.

Soft tissues

Negative.
IMPRESSION: Moderately severe appearing midfoot osteoarthritis.

Negative for fracture or ligament tear. There is some marrow edema
in the proximal second, third and fourth metatarsals most consistent
with stress change related to osteoarthritis.

## 2018-08-26 IMAGING — MR MR ANKLE*R* W/O CM
4 of 6 series · 19 of 40 positions shown · non-contrast
Comparison: Plain films the right foot 10/24/2013. Two views of the
right foot 09/04/2017.

CLINICAL DATA: Right foot and ankle pain for 2 years since the
patient dropped a roll of fabric onto her foot. Subsequent
encounter.

EXAM:
MRI OF THE RIGHT ANKLE WITHOUT CONTRAST
TECHNIQUE: Multiplanar, multisequence MR imaging of the ankle was performed. No
intravenous contrast was administered.

[Series 4: PD fat-sat · axial · 3.5mm · 0.31mm/px · z∈[-19,+94]mm · 6 of 27 slices shown]
[im 1/27]
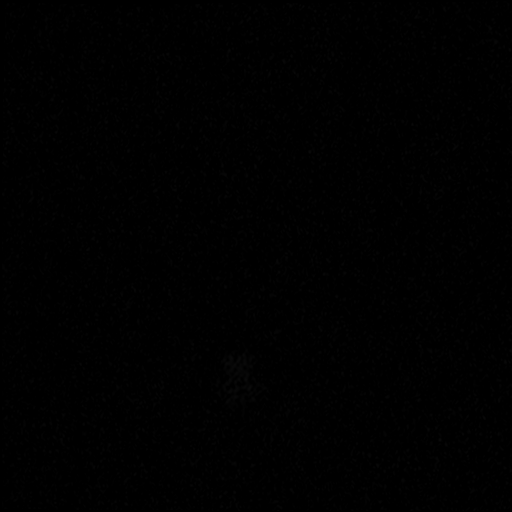
[im 6/27]
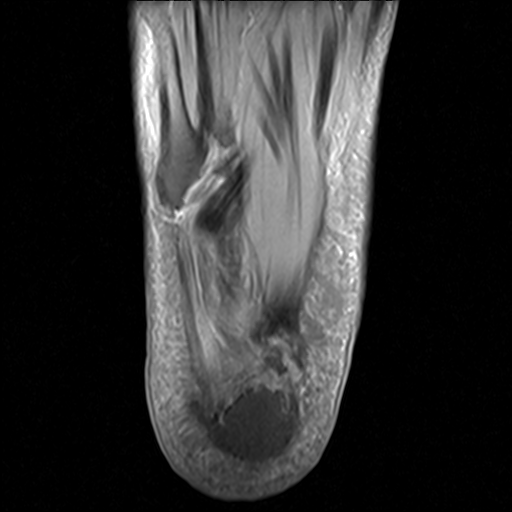
[im 11/27]
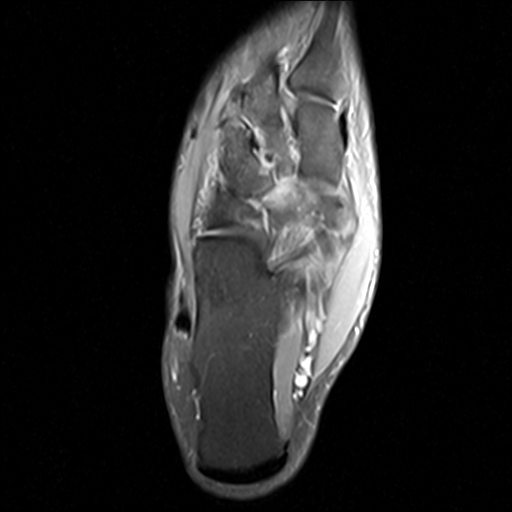
[im 16/27]
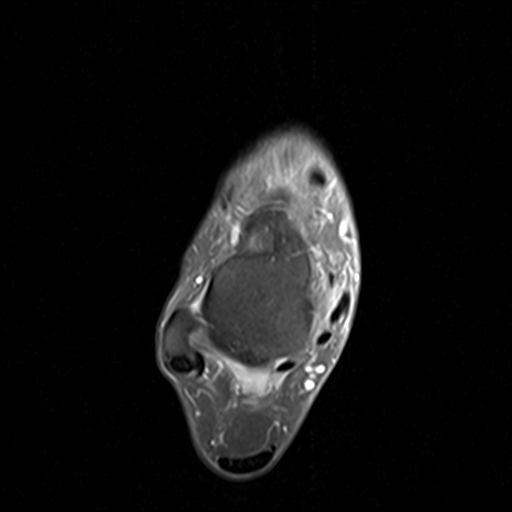
[im 21/27]
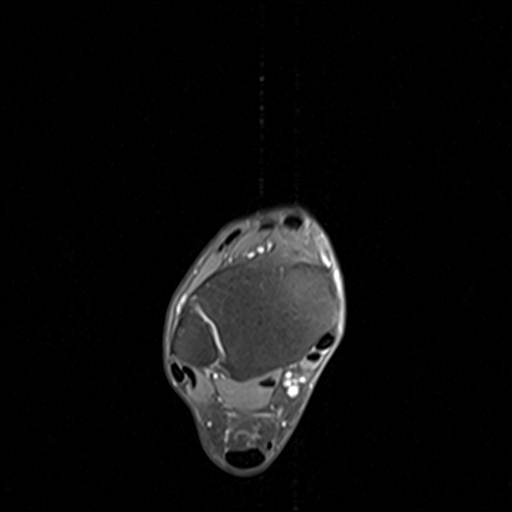
[im 27/27]
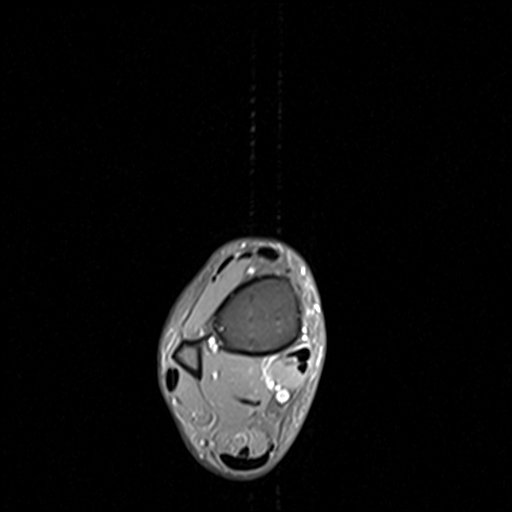

[Series 5: T2 fat-sat · axial · 3.5mm · 0.31mm/px · z∈[-19,+94]mm · 7 of 27 slices shown (1 of 3)]
[im 1/27]
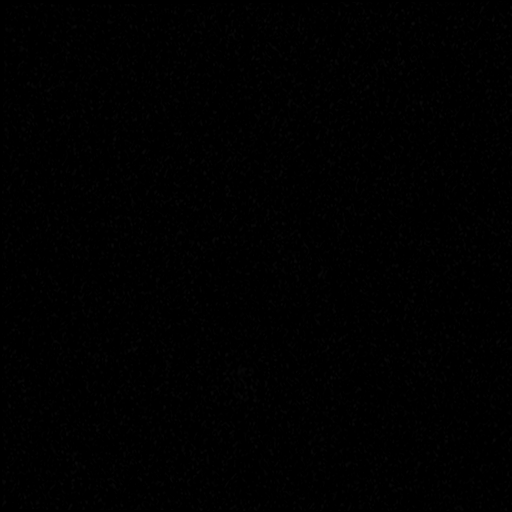
[im 5/27]
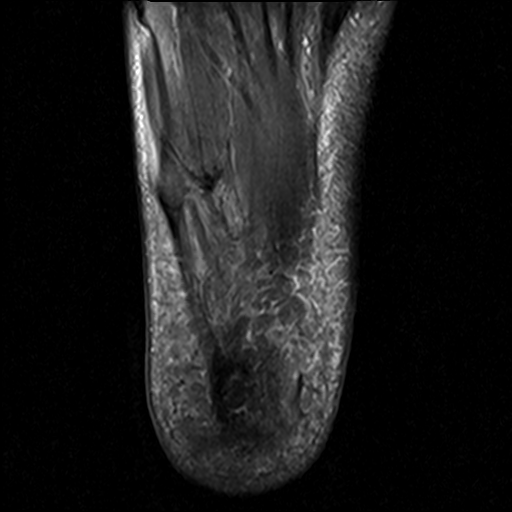
[im 9/27]
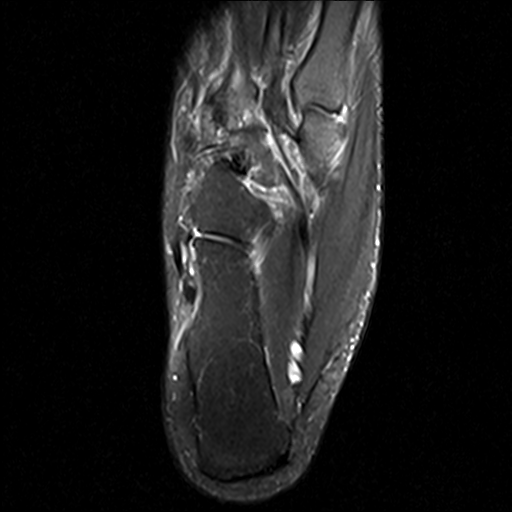
[im 14/27]
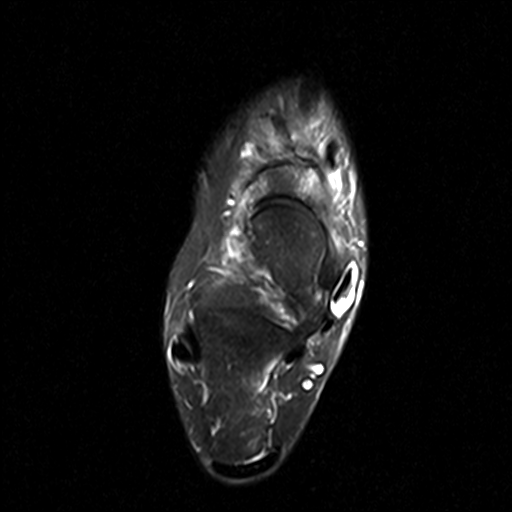
[im 18/27]
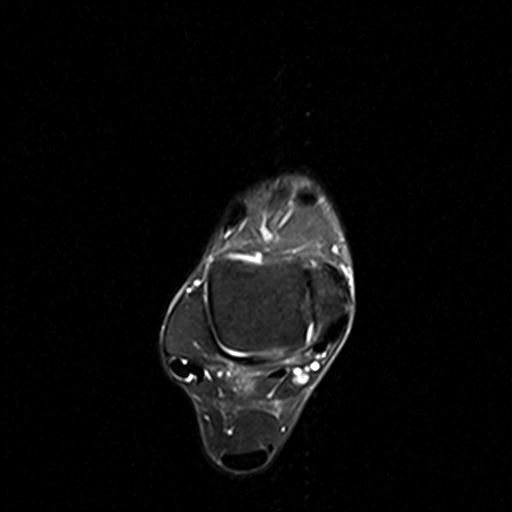
[im 22/27]
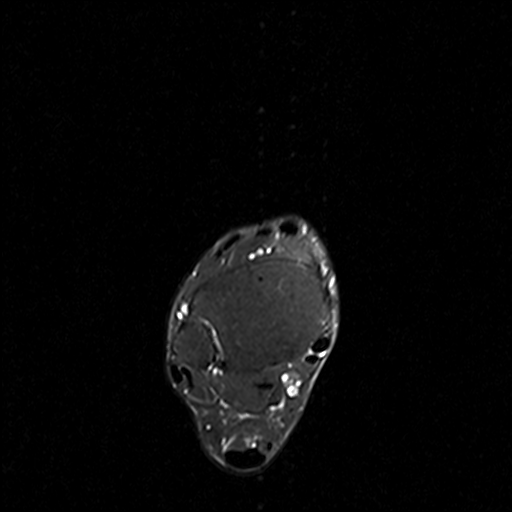
[im 27/27]
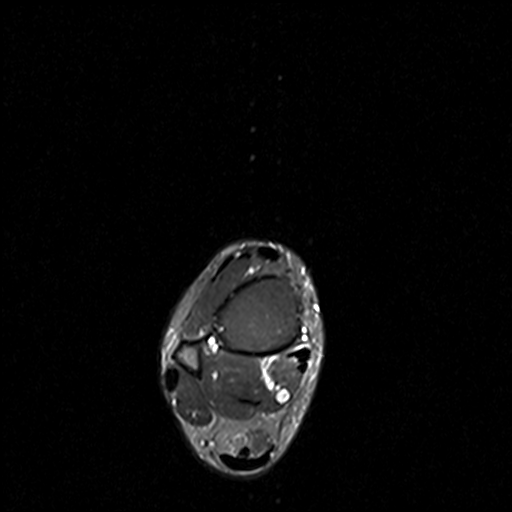

[Series 6: T2 fat-sat · sagittal · 3.0mm · 0.31mm/px · 3 of 24 slices shown (2 of 3)]
[im 5/24]
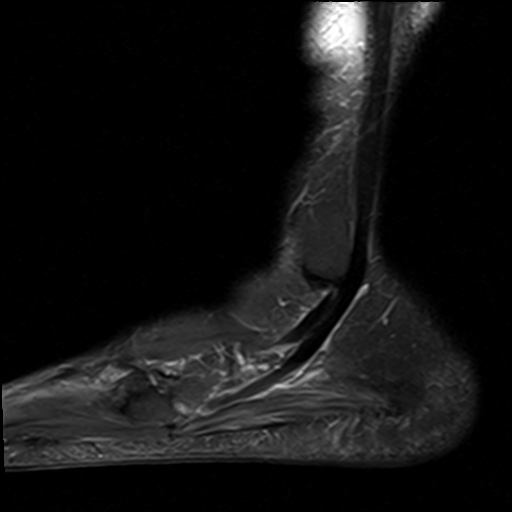
[im 14/24]
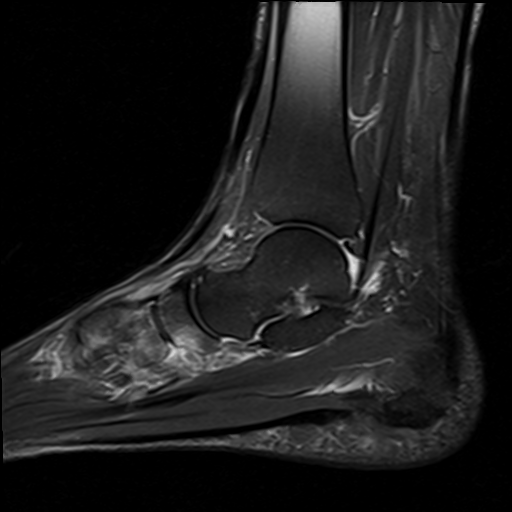
[im 24/24]
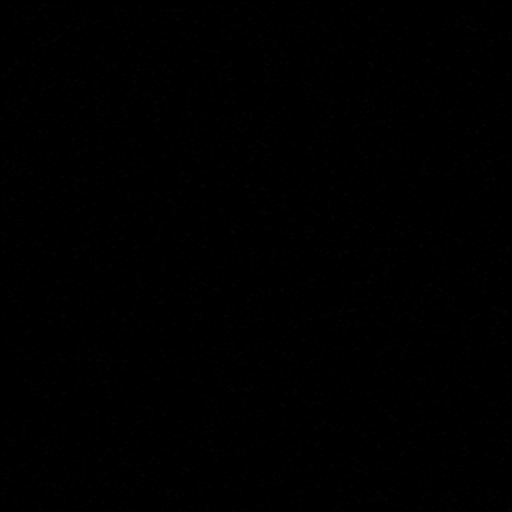

[Series 7: T2 fat-sat · coronal · 4.0mm · 0.31mm/px · 3 of 32 slices shown (3 of 3)]
[im 5/32]
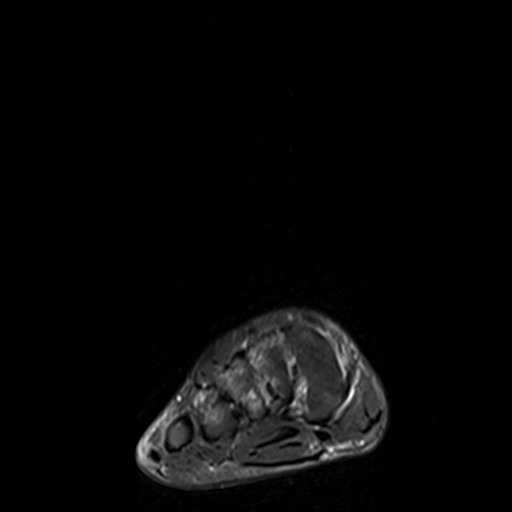
[im 18/32]
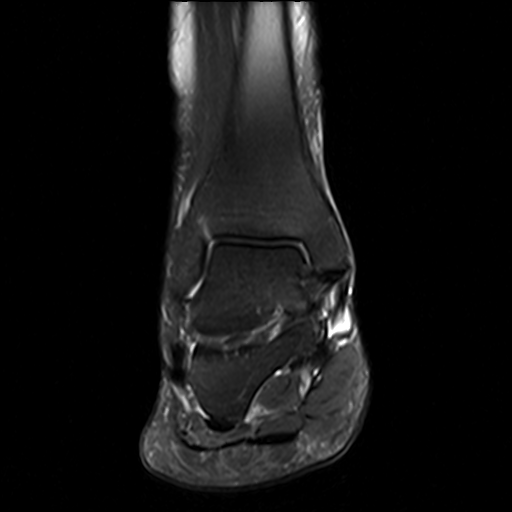
[im 27/32]
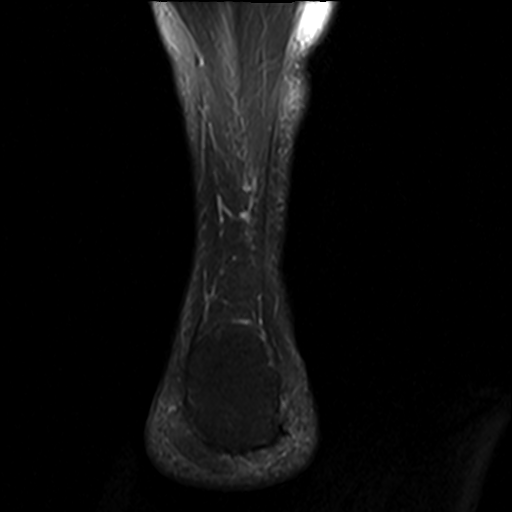

[19 of 40 positions shown; findings below may reference images not displayed]

FINDINGS: TENDONS

Peroneal: Intact.

Posteromedial: Intact. Prominent fluid is seen in the sheath of the
tibialis posterior tendon just proximal to its insertion on the
navicular compatible with tenosynovitis.

Anterior: Intact.

Achilles: Intact.

Plantar Fascia: Intact. Calcification in the proximal plantar fascia
most consistent with remote plantar fasciitis noted.

LIGAMENTS

Lateral: Intact.

Medial: Intact.

CARTILAGE

Ankle Joint: Normal.

Subtalar Joints/Sinus Tarsi: Sinus tarsi appears normal. Small
amount of fluid in the posterior subtalar recess incidentally noted.

Bones: Marrow edema in the bones of the midfoot is identified.
Please see report of dedicated right foot MRI this same day.

Other: None.
IMPRESSION: Findings compatible with tibialis posterior tenosynovitis without
tear.

Marrow edema in the bones the midfoot. Please see report of
dedicated right foot MRI this same day.

Findings consistent with remote plantar fasciitis.

## 2018-11-29 ENCOUNTER — Other Ambulatory Visit (HOSPITAL_BASED_OUTPATIENT_CLINIC_OR_DEPARTMENT_OTHER): Payer: Self-pay | Admitting: Physician Assistant

## 2018-11-29 DIAGNOSIS — Z1231 Encounter for screening mammogram for malignant neoplasm of breast: Secondary | ICD-10-CM

## 2019-01-10 ENCOUNTER — Ambulatory Visit (HOSPITAL_BASED_OUTPATIENT_CLINIC_OR_DEPARTMENT_OTHER): Payer: 59

## 2019-03-07 ENCOUNTER — Ambulatory Visit (HOSPITAL_BASED_OUTPATIENT_CLINIC_OR_DEPARTMENT_OTHER)
Admission: RE | Admit: 2019-03-07 | Discharge: 2019-03-07 | Disposition: A | Discharge status: Home / Self Care | Payer: 59 | Source: Ambulatory Visit | Attending: Physician Assistant | Admitting: Physician Assistant

## 2019-03-07 ENCOUNTER — Other Ambulatory Visit: Payer: Self-pay

## 2019-03-07 DIAGNOSIS — Z1231 Encounter for screening mammogram for malignant neoplasm of breast: Secondary | ICD-10-CM | POA: Insufficient documentation

## 2020-04-16 ENCOUNTER — Other Ambulatory Visit: Payer: Self-pay

## 2020-04-16 ENCOUNTER — Encounter (HOSPITAL_BASED_OUTPATIENT_CLINIC_OR_DEPARTMENT_OTHER): Payer: Self-pay | Admitting: *Deleted

## 2020-04-16 ENCOUNTER — Emergency Department (HOSPITAL_BASED_OUTPATIENT_CLINIC_OR_DEPARTMENT_OTHER): Payer: 59

## 2020-04-16 ENCOUNTER — Emergency Department (HOSPITAL_BASED_OUTPATIENT_CLINIC_OR_DEPARTMENT_OTHER)
Admission: EM | Admit: 2020-04-16 | Discharge: 2020-04-16 | Disposition: A | Discharge status: Home / Self Care | Payer: 59 | Attending: Emergency Medicine | Admitting: Emergency Medicine

## 2020-04-16 DIAGNOSIS — R0789 Other chest pain: Secondary | ICD-10-CM | POA: Diagnosis not present

## 2020-04-16 DIAGNOSIS — Z79899 Other long term (current) drug therapy: Secondary | ICD-10-CM | POA: Diagnosis not present

## 2020-04-16 DIAGNOSIS — E039 Hypothyroidism, unspecified: Secondary | ICD-10-CM | POA: Insufficient documentation

## 2020-04-16 DIAGNOSIS — Z79890 Hormone replacement therapy (postmenopausal): Secondary | ICD-10-CM | POA: Insufficient documentation

## 2020-04-16 DIAGNOSIS — I10 Essential (primary) hypertension: Secondary | ICD-10-CM | POA: Diagnosis not present

## 2020-04-16 DIAGNOSIS — Z87891 Personal history of nicotine dependence: Secondary | ICD-10-CM | POA: Diagnosis not present

## 2020-04-16 LAB — HEPATIC FUNCTION PANEL
ALT: 15 U/L (ref 0–44)
AST: 14 U/L — ABNORMAL LOW (ref 15–41)
Albumin: 3.9 g/dL (ref 3.5–5.0)
Alkaline Phosphatase: 52 U/L (ref 38–126)
Bilirubin, Direct: 0.1 mg/dL (ref 0.0–0.2)
Indirect Bilirubin: 0.4 mg/dL (ref 0.3–0.9)
Total Bilirubin: 0.5 mg/dL (ref 0.3–1.2)
Total Protein: 6.4 g/dL — ABNORMAL LOW (ref 6.5–8.1)

## 2020-04-16 LAB — CBC
HCT: 39.9 % (ref 36.0–46.0)
Hemoglobin: 13.2 g/dL (ref 12.0–15.0)
MCH: 28.1 pg (ref 26.0–34.0)
MCHC: 33.1 g/dL (ref 30.0–36.0)
MCV: 85.1 fL (ref 80.0–100.0)
Platelets: 235 10*3/uL (ref 150–400)
RBC: 4.69 MIL/uL (ref 3.87–5.11)
RDW: 13.2 % (ref 11.5–15.5)
WBC: 5 10*3/uL (ref 4.0–10.5)
nRBC: 0 % (ref 0.0–0.2)

## 2020-04-16 LAB — D-DIMER, QUANTITATIVE: D-Dimer, Quant: 0.79 ug/mL-FEU — ABNORMAL HIGH (ref 0.00–0.50)

## 2020-04-16 LAB — BASIC METABOLIC PANEL
Anion gap: 8 (ref 5–15)
BUN: 21 mg/dL (ref 8–23)
CO2: 26 mmol/L (ref 22–32)
Calcium: 9.1 mg/dL (ref 8.9–10.3)
Chloride: 104 mmol/L (ref 98–111)
Creatinine, Ser: 0.77 mg/dL (ref 0.44–1.00)
GFR calc Af Amer: >60 mL/min (ref >60)
GFR calc non Af Amer: >60 mL/min (ref >60)
Glucose, Bld: 97 mg/dL (ref 70–99)
Potassium: 3.9 mmol/L (ref 3.5–5.1)
Sodium: 138 mmol/L (ref 135–145)

## 2020-04-16 LAB — TROPONIN I (HIGH SENSITIVITY)
Troponin I (High Sensitivity): <2 ng/L (ref <18)
Troponin I (High Sensitivity): <2 ng/L (ref <18)

## 2020-04-16 LAB — LIPASE, BLOOD: Lipase: 32 U/L (ref 11–51)

## 2020-04-16 MED ORDER — IOHEXOL 350 MG/ML SOLN
100.0000 mL | Freq: Once | INTRAVENOUS | Status: AC | PRN
  Administered 2020-04-16: 100 mL via INTRAVENOUS

## 2020-04-16 MED ORDER — SODIUM CHLORIDE 0.9% FLUSH
3.0000 mL | Freq: Once | INTRAVENOUS | Status: DC
  Filled 2020-04-16: qty 3

## 2020-04-16 MED ORDER — CYCLOBENZAPRINE HCL 10 MG PO TABS: 10.0000 mg | ORAL_TABLET | Freq: Two times a day (BID) | ORAL | 0 refills | Status: DC | PRN

## 2020-04-16 NOTE — Discharge Instructions (Signed)
Your work-up today was overall reassuring.  We did the imaging to look for blood clot and it was negative.  Please follow-up with your primary doctor for further management.  I suspect your symptoms are more chest wall related given the tenderness on exam and the onset while you were doing yard work yesterday.  Please use the muscle relaxant and use over-the-counter anti-inflammatory and pain medication.  Please stay hydrated and rest.  If any symptoms change or worsen, please return to the nearest emergency department.

## 2020-04-16 NOTE — ED Triage Notes (Signed)
Chest pain yesterday while riding a lawn mower. Pain never went away. She has a headache today. Right shoulder pain and left hip.

## 2020-04-16 NOTE — ED Provider Notes (Signed)
[facility] EMERGENCY DEPARTMENT Provider Note   CSN: [id number] Arrival date & time: 04/16/20  1454     History Chief Complaint  Patient presents with  . Chest Pain    Jessica Valencia is a 59 y.o. female.  The history is provided by the patient and medical records. No language interpreter was used.  Chest Pain Pain location:  L chest and substernal area Pain quality: aching, dull and pressure   Pain radiates to:  Does not radiate Pain severity:  Moderate Onset quality:  Sudden Duration:  1 day Timing:  Constant Progression:  Waxing and waning Chronicity:  New Context: breathing   Relieved by:  Nothing Worsened by:  Certain positions, deep breathing and movement Ineffective treatments:  None tried (gasX) Associated symptoms: shortness of breath   Associated symptoms: no abdominal pain, no altered mental status, no anxiety, no back pain, no claudication, no cough, no diaphoresis, no dizziness, no fatigue, no fever, no headache, no lower extremity edema, no nausea, no near-syncope, no numbness, no palpitations, no syncope, no vomiting and no weakness   Risk factors: hypertension   Risk factors: no coronary artery disease, no diabetes mellitus, no high cholesterol, not female, no prior DVT/PE and no smoking        Past Medical History:  Diagnosis Date  . HTN (hypertension)   . Hypothyroid     Patient Active Problem List   Diagnosis Date Noted  . SUI (stress urinary incontinence, female) 12/28/2016    Past Surgical History:  Procedure Laterality Date  . CARPAL TUNNEL RELEASE  1999   Right Carpal Tunnel   . FINGER SURGERY  10/2010   Left trigger finger surgery Dr Betha Loa   . OOPHORECTOMY  1999   Right Ovary Removed      OB History   No obstetric history on file.     Family History  Problem Relation Age of Onset  . Heart attack Father   . CAD Father   . Thyroid disease Maternal Grandmother     Social History   Tobacco Use  . Smoking  status: Former Games developer  . Smokeless tobacco: Never Used  Substance Use Topics  . Alcohol use: No  . Drug use: Not on file    Home Medications Prior to Admission medications   Medication Sig Start Date End Date Taking? Authorizing Provider  ALPRAZolam Prudy Feeler) 0.25 MG tablet Take 0.25 mg by mouth at bedtime as needed for anxiety. Xanax 0.25 MG Tablet 1 tablet daily as needed   Yes [provider]  ibuprofen (ADVIL,MOTRIN) 800 MG tablet  08/10/17  Yes [provider]  levothyroxine (SYNTHROID, LEVOTHROID) 112 MCG tablet Take 112 mcg by mouth as directed. Levothyroxine Sodium 112 MCG Tablet TAKE 1 TABLET BY MOUTH EVERY DAY 6 Days a week. None on Sunday   Yes [provider]  lisinopril (PRINIVIL,ZESTRIL) 10 MG tablet Take 10 mg by mouth daily. Lisinopril 10 MG Tablet TAKE 1 TABLET BY MOUTH EVERY DAY   Yes [provider]  omeprazole (PRILOSEC) 40 MG capsule Take 40 mg by mouth daily. Omeprazole 40 MG Capsule Delayed Release 1 capsule Once a day   Yes [provider]  TRAZODONE HCL PO Take by mouth. Trazodone HCl 50 MG Tablet 1-2 tablets at bedtime   Yes [provider]  ALPRAZolam (XANAX) 0.25 MG tablet Take by mouth.    [provider]  B Complex Vitamins (B COMPLEX PO) Take by mouth.    [provider]  DOCOSAHEXAENOIC ACID PO Take by mouth.    [provider]  eszopiclone Johnnye Sima) 2 MG TABS tablet  08/09/17   [provider]  FLUARIX QUADRIVALENT 0.5 ML injection  07/30/17   [provider]  hydrochlorothiazide (MICROZIDE) 12.5 MG capsule Take 12.5 mg by mouth daily. Hydrochlorothiazide 12.5 MG Capsule TAKE ONE CAPSULE BY MOUTH EVERY DAY    [provider]  meloxicam (MOBIC) 7.5 MG tablet Take 7.5 mg daily by mouth.    [provider]  methylPREDNISolone (MEDROL DOSEPAK) 4 MG TBPK tablet 6 day dose pack - take as directed 09/06/17   Hyatt, Max T, DPM  ondansetron (ZOFRAN) 4 MG  tablet Take 4 mg by mouth every 8 (eight) hours as needed for nausea or vomiting. Zofran 4 MG Tablet 1 tablets three times a day as needed for nausea    [provider]    Allergies    Phenergan [promethazine hcl]  Review of Systems   Review of Systems  Constitutional: Negative for chills, diaphoresis, fatigue and fever.  HENT: Negative for congestion.   Eyes: Negative for visual disturbance.  Respiratory: Positive for shortness of breath. Negative for cough, choking, chest tightness and wheezing.   Cardiovascular: Positive for chest pain. Negative for palpitations, claudication, leg swelling, syncope and near-syncope.  Gastrointestinal: Negative for abdominal pain, constipation, diarrhea, nausea and vomiting.  Genitourinary: Negative for dysuria and flank pain.  Musculoskeletal: Negative for back pain, neck pain and neck stiffness.  Skin: Negative for rash and wound.  Neurological: Negative for dizziness, seizures, weakness, light-headedness, numbness and headaches.  Psychiatric/Behavioral: Negative for agitation and confusion.  All other systems reviewed and are negative.   Physical Exam Updated Vital Signs BP (!) 150/75   Pulse 65   Temp 98.2 F (36.8 C) (Oral)   Resp 18   Ht 5\' 2"  (1.575 m)   Wt 62.1 kg   SpO2 97%   BMI 25.06 kg/m   Physical Exam Vitals and nursing note reviewed.  Constitutional:      General: She is not in acute distress.    Appearance: She is well-developed. She is not ill-appearing, toxic-appearing or diaphoretic.  HENT:     Head: Normocephalic and atraumatic.     Right Ear: External ear normal.     Left Ear: External ear normal.     Nose: Nose normal.     Mouth/Throat:     Pharynx: No oropharyngeal exudate.  Eyes:     Conjunctiva/sclera: Conjunctivae normal.     Pupils: Pupils are equal, round, and reactive to light.  Cardiovascular:     Rate and Rhythm: Normal rate.     Heart sounds: Heart sounds not distant.  Pulmonary:      Effort: Pulmonary effort is normal. No tachypnea or respiratory distress.     Breath sounds: No stridor. No decreased breath sounds, wheezing, rhonchi or rales.  Chest:     Chest wall: Tenderness present. No crepitus.    Abdominal:     General: There is no distension.     Palpations: Abdomen is soft.     Tenderness: There is no abdominal tenderness. There is no rebound.  Musculoskeletal:     Cervical back: Normal range of motion and neck supple.     Right lower leg: No tenderness. No edema.     Left lower leg: No tenderness. No edema.  Skin:    General: Skin is warm.     Capillary Refill: Capillary refill takes less than 2  seconds.     Findings: No erythema or rash.  Neurological:     General: No focal deficit present.     Mental Status: She is alert.     Motor: No abnormal muscle tone.     Coordination: Coordination normal.     Deep Tendon Reflexes: Reflexes are normal and symmetric.  Psychiatric:        Mood and Affect: Mood normal.     ED Results / Procedures / Treatments   Labs (all labs ordered are listed, but only abnormal results are displayed) Labs Reviewed  HEPATIC FUNCTION PANEL - Abnormal; Notable for the following components:      Result Value   Total Protein 6.4 (*)    AST 14 (*)    All other components within normal limits  D-DIMER, QUANTITATIVE (NOT AT [facility]) - Abnormal; Notable for the following components:   D-Dimer, Quant 0.79 (*)    All other components within normal limits  BASIC METABOLIC PANEL  CBC  LIPASE, BLOOD  TROPONIN I (HIGH SENSITIVITY)  TROPONIN I (HIGH SENSITIVITY)    EKG EKG Interpretation  Date/Time:  Friday April 16 2020 14:54:53 EDT Ventricular Rate:  65 PR Interval:  156 QRS Duration: 78 QT Interval:  408 QTC Calculation: 424 R Axis:   7 Text Interpretation: Normal sinus rhythm Normal ECG When compared to prior, no significant changes seen. No STEMI Confirmed by Theda Belfastegeler, Chris ([id number]) on 04/16/2020 3:10:30  PM   Radiology DG Chest 2 View  Result Date: 04/16/2020 CLINICAL DATA:  Chest pain EXAM: CHEST - 2 VIEW COMPARISON:  2011 FINDINGS: The heart size and mediastinal contours are within normal limits. Both lungs are clear. No pleural effusion or pneumothorax. The visualized skeletal structures are unremarkable. IMPRESSION: No acute process in the chest. Electronically Signed   By: Guadlupe SpanishPraneil  Patel M.D.   On: 04/16/2020 15:55   CT Angio Chest PE W and/or Wo Contrast  Result Date: 04/16/2020 CLINICAL DATA:  Left anterior chest pain EXAM: CT ANGIOGRAPHY CHEST WITH CONTRAST TECHNIQUE: Multidetector CT imaging of the chest was performed using the standard protocol during bolus administration of intravenous contrast. Multiplanar CT image reconstructions and MIPs were obtained to evaluate the vascular anatomy. CONTRAST:  100mL OMNIPAQUE IOHEXOL 350 MG/ML SOLN COMPARISON:  Chest x-ray 04/16/2020 FINDINGS: Cardiovascular: Satisfactory opacification of the pulmonary arteries to the segmental level. No evidence of pulmonary embolism. Normal heart size. No pericardial effusion. Nonaneurysmal aorta. Mediastinum/Nodes: No enlarged mediastinal, hilar, or axillary lymph nodes. Thyroid gland, trachea, and esophagus demonstrate no significant findings. Lungs/Pleura: Lungs are clear. No pleural effusion or pneumothorax. Upper Abdomen: Aortic atherosclerosis.  No acute abnormality. Musculoskeletal: No chest wall abnormality. No acute or significant osseous findings. Review of the MIP images confirms the above findings. IMPRESSION: 1. Negative. No CT evidence for acute pulmonary embolus. Clear lung fields. 2. Aortic atherosclerosis. Aortic Atherosclerosis (ICD10-I70.0). Electronically Signed   By: Jasmine PangKim  Fujinaga M.D.   On: 04/16/2020 17:35    Procedures Procedures (including critical care time)  Medications Ordered in ED Medications  sodium chloride flush (NS) 0.9 % injection 3 mL (has no administration in time range)   iohexol (OMNIPAQUE) 350 MG/ML injection 100 mL (100 mLs Intravenous Contrast Given 04/16/20 1633)    ED Course  I have reviewed the triage vital signs and the nursing notes.  Pertinent labs & imaging results that were available during my care of the patient were reviewed by me and considered in my medical decision making (see chart for details).  MDM Rules/Calculators/A&P                          JAMIYAH DINGLEY is a 59 y.o. female with a past medical history significant for hypothyroidism and hypertension who presents with chest discomfort.  Patient reports that she was riding her lawnmower yesterday when she started having a chest discomfort.  It was sudden onset and is pressure and aching in quality.  It is in her central left chest and does not radiate.  She reports that it is a soreness and it does not seem to be exertional.  She does report it is positional at times when she bends over to pick up something it worsen earlier.  She denies any fevers, chills, diaphoresis, nausea, vomiting.  She denies palpitations or lightheadedness or syncope.  She does report some shortness of breath and discomfort when she tries to take a deep breath.  She denies any cough.  Denies any sick contacts.  She denies any recent urinary or GI symptoms.  She thinks she may have pulled a muscle if she was loading or unloading the bag on the back of the mower but she does not remember.  She reports that it waxed and waned yesterday and then when it persisted today she went to urgent care for evaluation and subsequently she was sent here for evaluation and chest pain rule out.  She currently reports her pain is a 2 out of 10 in severity but she reports the maximum was 7 out of 10 yesterday.  She denies any productive cough.  On exam, chest is very tender to palpation at the site of the discomfort.  It reproduces her pain.  No rash seen.  Lungs clear and no murmur was appreciated.  Good pulses in all extremities.  Legs  are nontender nonedematous.  She denies history of DVT or PE.  She denies leg pain or leg swelling.  Abdomen nontender.  Back and flanks nontender.  Patient well-appearing.  EKG shows no STEMI and appears similar to prior.  Heart score calculated as a 1.  Given the patient's somewhat pleuritic discomfort and feeling that her breath is catching when she dressed a daily breath, will get a D-dimer added to rule out PE.  Will get chest x-ray and other labs.  Will trend troponin.  Suspect musculoskeletal pain given the possible muscular onset manipulating the mower and the tenderness of the chest wall.  Patient agrees with this plan and work-up and anticipate discharge if work-up is reassuring.         4:14 PM D-dimer is elevated.  We will get a CT PE study to rule out pulmonary embolism.  6:16 PM The CT scan did not show evidence of pulmonary embolism.  There was some atherosclerosis seen which I subsequently informed patient of.  Troponin negative x2.  Other work-up reassuring.  Patient is feeling better.  Suspect musculoskeletal chest wall pain.  There was some mild muscle spasm on palpation.  Suspect chest wall discomfort.  Patient will be given prescription for muscle relaxant and use over-the-counter anti-inflammatory and pain medication.  She will follow up with PCP.  Patient agreed with plan of care and had no other questions or concerns.  Patient discharged in good condition.    Final Clinical Impression(s) / ED Diagnoses Final diagnoses:  Chest wall pain  Atypical chest pain    Rx / DC Orders ED Discharge Orders  Ordered    cyclobenzaprine (FLEXERIL) 10 MG tablet  2 times daily PRN     Discontinue  Reprint     04/16/20 1811          Clinical Impression: 1. Chest wall pain   2. Atypical chest pain     Disposition: Discharge  Condition: Good  I have discussed the results, Dx and Tx plan with the pt(& family if present). He/she/they expressed understanding and  agree(s) with the plan. Discharge instructions discussed at great length. Strict return precautions discussed and pt &/or family have verbalized understanding of the instructions. No further questions at time of discharge.    New Prescriptions   CYCLOBENZAPRINE (FLEXERIL) 10 MG TABLET    Take 1 tablet (10 mg total) by mouth 2 (two) times daily as needed.    Follow Up: Ethel Rana [address] [place] [address] [phone]     [facility] EM[facility]GENCY DEPARTMENT [id number] [place] [address] [phone]       Soleia Badolato, Canary Brim, MD 04/16/20 [phone]

## 2020-04-23 ENCOUNTER — Other Ambulatory Visit (HOSPITAL_BASED_OUTPATIENT_CLINIC_OR_DEPARTMENT_OTHER): Payer: Self-pay | Admitting: Physician Assistant

## 2020-04-23 DIAGNOSIS — Z1231 Encounter for screening mammogram for malignant neoplasm of breast: Secondary | ICD-10-CM

## 2020-04-27 ENCOUNTER — Ambulatory Visit (HOSPITAL_BASED_OUTPATIENT_CLINIC_OR_DEPARTMENT_OTHER)
Admission: RE | Admit: 2020-04-27 | Discharge: 2020-04-27 | Disposition: A | Discharge status: Home / Self Care | Payer: 59 | Source: Ambulatory Visit | Attending: Physician Assistant | Admitting: Physician Assistant

## 2020-04-27 ENCOUNTER — Other Ambulatory Visit: Payer: Self-pay

## 2020-04-27 ENCOUNTER — Encounter (HOSPITAL_BASED_OUTPATIENT_CLINIC_OR_DEPARTMENT_OTHER): Payer: Self-pay

## 2020-04-27 DIAGNOSIS — Z1231 Encounter for screening mammogram for malignant neoplasm of breast: Secondary | ICD-10-CM

## 2020-09-29 ENCOUNTER — Encounter: Payer: Self-pay | Admitting: Neurology

## 2020-10-27 ENCOUNTER — Emergency Department (HOSPITAL_BASED_OUTPATIENT_CLINIC_OR_DEPARTMENT_OTHER)
Admission: EM | Admit: 2020-10-27 | Discharge: 2020-10-27 | Disposition: A | Discharge status: Home / Self Care | Payer: Worker's Compensation | Attending: Emergency Medicine | Admitting: Emergency Medicine

## 2020-10-27 ENCOUNTER — Encounter (HOSPITAL_BASED_OUTPATIENT_CLINIC_OR_DEPARTMENT_OTHER): Payer: Self-pay

## 2020-10-27 ENCOUNTER — Other Ambulatory Visit: Payer: Self-pay

## 2020-10-27 ENCOUNTER — Emergency Department (HOSPITAL_BASED_OUTPATIENT_CLINIC_OR_DEPARTMENT_OTHER): Payer: Worker's Compensation

## 2020-10-27 DIAGNOSIS — Z87891 Personal history of nicotine dependence: Secondary | ICD-10-CM | POA: Insufficient documentation

## 2020-10-27 DIAGNOSIS — I10 Essential (primary) hypertension: Secondary | ICD-10-CM | POA: Insufficient documentation

## 2020-10-27 DIAGNOSIS — Z79899 Other long term (current) drug therapy: Secondary | ICD-10-CM | POA: Diagnosis not present

## 2020-10-27 DIAGNOSIS — M25511 Pain in right shoulder: Secondary | ICD-10-CM | POA: Insufficient documentation

## 2020-10-27 MED ORDER — CYCLOBENZAPRINE HCL 10 MG PO TABS
10.0000 mg | ORAL_TABLET | Freq: Two times a day (BID) | ORAL | 0 refills | Status: DC | PRN
Start: 2020-10-27 — End: 2020-12-23

## 2020-10-27 NOTE — ED Provider Notes (Signed)
[facility] EMERGENCY DEPARTMENT Provider Note   CSN: [id number] Arrival date & time: 10/27/20  1612     History Chief Complaint  Patient presents with  . Shoulder Injury    Jessica Valencia is a 60 y.o. female.  HPI   Patient with no significant medical history presents to the emergency department with chief complaint of right shoulder pain.  Patient endorses pain started abruptly around 2 PM, she states she was lifting mattress covers out of a box and moved her arm the wrong way.  She had severe pain in her shoulder and it has increasing gotten worse.  She has tried taking over-the-counter pain medication without any relief.  She denies hitting her head, losing consciousness, is not anticoagulant.  She denies paresthesias or weakness in her hand, is able to move her arm but has pain in her shoulder when she does.  she denies alleviating factors.  Patient has headaches, fevers, chills, shortness of breath, chest pain, abdominal pain, nausea, vomiting, diarrhea, pedal edema.  Past Medical History:  Diagnosis Date  . HTN (hypertension)   . Hypothyroid     Patient Active Problem List   Diagnosis Date Noted  . SUI (stress urinary incontinence, female) 12/28/2016    Past Surgical History:  Procedure Laterality Date  . CARPAL TUNNEL RELEASE  1999   Right Carpal Tunnel   . FINGER SURGERY  10/2010   Left trigger finger surgery Dr Leanora Cover   . OOPHORECTOMY  1999   Right Ovary Removed      OB History   No obstetric history on file.     Family History  Problem Relation Age of Onset  . Heart attack Father   . CAD Father   . Thyroid disease Maternal Grandmother     Social History   Tobacco Use  . Smoking status: Former Research scientist (life sciences)  . Smokeless tobacco: Never Used  Substance Use Topics  . Alcohol use: No  . Drug use: Never    Home Medications Prior to Admission medications   Medication Sig Start Date End Date Taking? Authorizing Provider  cyclobenzaprine  (FLEXERIL) 10 MG tablet Take 1 tablet (10 mg total) by mouth 2 (two) times daily as needed for muscle spasms. 10/27/20  Yes Marcello Fennel, PA-C  ALPRAZolam Duanne Moron) 0.25 MG tablet Take 0.25 mg by mouth at bedtime as needed for anxiety. Xanax 0.25 MG Tablet 1 tablet daily as needed    [provider]  ALPRAZolam (XANAX) 0.25 MG tablet Take by mouth.    [provider]  B Complex Vitamins (B COMPLEX PO) Take by mouth.    [provider]  cyclobenzaprine (FLEXERIL) 10 MG tablet Take 1 tablet (10 mg total) by mouth 2 (two) times daily as needed. 04/16/20   Tegeler, Gwenyth Allegra, MD  DOCOSAHEXAENOIC ACID PO Take by mouth.    [provider]  eszopiclone Johnnye Sima) 2 MG TABS tablet  08/09/17   [provider]  FLUARIX QUADRIVALENT 0.5 ML injection  07/30/17   [provider]  hydrochlorothiazide (MICROZIDE) 12.5 MG capsule Take 12.5 mg by mouth daily. Hydrochlorothiazide 12.5 MG Capsule TAKE ONE CAPSULE BY MOUTH EVERY DAY    [provider]  ibuprofen (ADVIL,MOTRIN) 800 MG tablet  08/10/17   [provider]  levothyroxine (SYNTHROID, LEVOTHROID) 112 MCG tablet Take 112 mcg by mouth as directed. Levothyroxine Sodium 112 MCG Tablet TAKE 1 TABLET BY MOUTH EVERY DAY 6 Days a week. None on Sunday    [provider]  lisinopril (PRINIVIL,ZESTRIL) 10 MG tablet Take 10 mg by mouth daily. Lisinopril 10 MG Tablet TAKE 1 TABLET BY MOUTH EVERY DAY    [provider]  meloxicam (MOBIC) 7.5 MG tablet Take 7.5 mg daily by mouth.    [provider]  methylPREDNISolone (MEDROL DOSEPAK) 4 MG TBPK tablet 6 day dose pack - take as directed 09/06/17   Hyatt, Max T, DPM  omeprazole (PRILOSEC) 40 MG capsule Take 40 mg by mouth daily. Omeprazole 40 MG Capsule Delayed Release 1 capsule Once a day    [provider]  ondansetron (ZOFRAN) 4 MG tablet Take 4 mg by mouth every 8 (eight) hours as needed for nausea or vomiting.  Zofran 4 MG Tablet 1 tablets three times a day as needed for nausea    [provider]  TRAZODONE HCL PO Take by mouth. Trazodone HCl 50 MG Tablet 1-2 tablets at bedtime    [provider]    Allergies    Phenergan [promethazine hcl]  Review of Systems   Review of Systems  Constitutional: Negative for chills and fever.  HENT: Negative for congestion.   Respiratory: Negative for shortness of breath.   Cardiovascular: Negative for chest pain.  Gastrointestinal: Negative for abdominal pain.  Genitourinary: Negative for enuresis.  Musculoskeletal: Negative for back pain.       Right shoulder pain.  Skin: Negative for rash.  Neurological: Negative for dizziness.  Hematological: Does not bruise/bleed easily.    Physical Exam Updated Vital Signs BP 139/88   Pulse 85   Temp 98.4 F (36.9 C)   Resp 16   Ht 5\' 2"  (1.575 m)   Wt 65.8 kg   SpO2 100%   BMI 26.52 kg/m   Physical Exam Vitals and nursing note reviewed.  Constitutional:      General: She is not in acute distress.    Appearance: She is not ill-appearing.  HENT:     Head: Normocephalic and atraumatic.     Nose: No congestion.  Eyes:     Conjunctiva/sclera: Conjunctivae normal.  Neck:     Comments: Patient's neck was palpated there is no tenderness along her cervical spine, she did have noticed tenderness along her deltoid, no gross abnormalities noted. Cardiovascular:     Rate and Rhythm: Normal rate.  Pulmonary:     Effort: Pulmonary effort is normal.  Musculoskeletal:        General: Tenderness present.     Cervical back: Normal range of motion. Tenderness present.     Comments: Patient's right arm was visualized, she had full range of motion at her fingers, wrist, elbow, decreased range of motion at her shoulder with adduction.  She was tender to palpation along the anterior aspect of her deltoid, no gross hemorrhoids noted.  Neurovascular fully intact compartments were soft  Skin:     General: Skin is warm and dry.  Neurological:     Mental Status: She is alert.  Psychiatric:        Mood and Affect: Mood normal.     ED Results / Procedures / Treatments   Labs (all labs ordered are listed, but only abnormal results are displayed) Labs Reviewed - No data to display  EKG None  Radiology DG Shoulder Right  Result Date: 10/27/2020 CLINICAL DATA:  Status post trauma. EXAM: RIGHT SHOULDER - 2+ VIEW COMPARISON:  None. FINDINGS: There is no evidence of an acute fracture or dislocation. There is no evidence of arthropathy or other  focal bone abnormality. Soft tissues are unremarkable. IMPRESSION: Negative. Electronically Signed   By: Aram Candela M.D.   On: 10/27/2020 17:11    Procedures Procedures (including critical care time)  Medications Ordered in ED Medications - No data to display  ED Course  I have reviewed the triage vital signs and the nursing notes.  Pertinent labs & imaging results that were available during my care of the patient were reviewed by me and considered in my medical decision making (see chart for details).    MDM Rules/Calculators/A&P                          Patient presents with right shoulder pain.  She is alert, does not appear in acute distress, vital signs reassuring.  Will obtain x-ray for further evaluation.  Right shoulder x-ray does not reveal any acute findings.   Low suspicion for fracture or dislocation as x-ray does not feel any significant findings. low suspicion for ligament or tendon damage as area was palpated no gross defects noted, she had full range of motion at her right hand, wrist, elbow.  She does have noted decreased range of motion in her right shoulder but I suspect this is secondary due to pain.  Low suspicion for compartment syndrome as area was palpated it was soft to the touch, neurovascular fully intact.  I suspect patient suffering from a muscular strain in her shoulder will provide her with a sling for  supports, shoulder exercises and have her follow-up with sports medicine for further evaluation.  Vital signs have remained stable, no indication for hospital admission. Patient given at home care as well strict return precautions.  Patient verbalized that they understood agreed to said plan.   Final Clinical Impression(s) / ED Diagnoses Final diagnoses:  Acute pain of right shoulder    Rx / DC Orders ED Discharge Orders         Ordered    cyclobenzaprine (FLEXERIL) 10 MG tablet  2 times daily PRN        10/27/20 1807           Carroll Sage, PA-C 10/27/20 Silva Bandy    Tilden Fossa, MD 10/27/20 1932

## 2020-10-27 NOTE — Discharge Instructions (Addendum)
You have been seen here for right shoulder pain.  I placed you in a sling I recommended for 1 week's time, you may take it off at nighttime.  I have given you a prescription for a muscle relaxer please be aware this medication make you sleepy I do not recommend operate heavy machinery or consume alcohol while taking this medication.  Recommend taking this at nighttime.  I recommend taking over-the-counter pain medications like ibuprofen and/or Tylenol every 6 as needed.  Please follow dosage and on the back of bottle.  I also recommend applying heat to the area and stretching out the muscles as this will help decrease stiffness and pain.  I have given you information on exercises please follow.  Give you the contact information for a sports medicine doctor I reconciled them for further evaluation.  Come back to the emergency department if you develop chest pain, shortness of breath, severe abdominal pain, uncontrolled nausea, vomiting, diarrhea.

## 2020-10-27 NOTE — ED Notes (Signed)
Radiology study reviewed

## 2020-10-27 NOTE — ED Notes (Signed)
In to round on client, pt states she was at working moving and pulling mattress covers and felt pain and discomfort in rt shoulder, radiates to rt side of neck and also has a HA. Noted to have strong rt hand grip, rt radial pulse easily palpable, 2+, good capillary refill also noted. Able to raise rt arm without assistance. Currently resting on stretcher, ice pack at rt shoulder, rates pain at approx 7 out of 0-10 scale, states incident, injury occurred at approx 1400hrs today

## 2020-10-27 NOTE — ED Triage Notes (Signed)
Pt reports R shoulder pain after an injury at work today within an hr ago. Pt states she was pulling on a mattress cover at work when she felt pain in her R shoulder.

## 2020-10-29 ENCOUNTER — Telehealth: Payer: Self-pay | Admitting: Family Medicine

## 2020-10-29 NOTE — Telephone Encounter (Signed)
Called pt to scheduled ED follow appt-- advised this is a WC injury & was given [employer] /Employer info-cld Amy Tuck/HR @ [phone] per their WC carrier schedules WC f/u appt w/ contracted/Ntwk providers 7 will contact patient w/ that information. --glh

## 2020-12-22 NOTE — Progress Notes (Signed)
[facility] Clinic Note - Initial Visit   Date: 12/23/20  Jessica Valencia MRN: [id number] DOB: 05-30-1961   Dear Dr. Allena Katz:  Thank you for your kind referral of Jessica Valencia for consultation of bilateral feet pain. Although her history is well known to you, please allow [place] to reiterate it for the purpose of our medical record. The patient was accompanied to the clinic by self.  History of Present Illness: Jessica Valencia is a 60 y.o. right-handed female with hypertension, GERD, hypothyroidism presenting for evaluation of bilateral feet pain.   Starting 2020, she began having achy, throbbing, and burning pain over the feet, especially if she has anything touching her feet, such as the bed sheets.  She is unable to walk barefoot anymore because it aggravates her pain.  She described throbbing and sensitivity over the distal foot, which is worse with tactile stimuli, especially if she squeezes the feet.  No imbalance, weakness, numbness, or tingling of the feet.  She gets some relief with soaking her feet in hot water.  She has tried lidocaine patches which initially helped.  She has tried ibuprofen 800mg  twice daily which does not help.  She saw Sports Medicine who did not feel symptoms were musculoskeletal in nature.   She works as a for a Merchandiser, retail. She initially though it was because she was on her feet all day.  She is not diabetes and does not drink alcohol.  Out-side paper records, electronic medical record, and images have been reviewed where available and summarized as:  Lab Results  Component Value Date   HGBA1C  08/02/2010    4.9 (NOTE)                                                                       According to the ADA Clinical Practice Recommendations for 2011, when HbA1c is used as a screening test:   >=6.5%   Diagnostic of Diabetes Mellitus           (if abnormal result  is confirmed)  5.7-6.4%   Increased risk of developing  Diabetes Mellitus  References:Diagnosis and Classification of Diabetes Mellitus,Diabetes Care,2011,34(Suppl 1):S62-S69 and Standards of Medical Care in         Diabetes - 2011,Diabetes Care,2011,34  (Suppl 1):S11-S61.   No results found for: VITAMINB12 Lab Results  Component Value Date   TSH 0.665 08/02/2010   No results found for: ESRSEDRATE, POCTSEDRATE  Past Medical History:  Diagnosis Date  . HTN (hypertension)   . Hypothyroid     Past Surgical History:  Procedure Laterality Date  . CARPAL TUNNEL RELEASE  1999   Right Carpal Tunnel   . FINGER SURGERY  10/2010   Left trigger finger surgery Dr 11/2010   . OOPHORECTOMY  1999   Right Ovary Removed      Medications:  Outpatient Encounter Medications as of 12/23/2020  Medication Sig  . ALPRAZolam (XANAX) 0.25 MG tablet Take 0.25 mg by mouth at bedtime as needed for anxiety. Xanax 0.25 MG Tablet 1 tablet daily as needed  . DOCOSAHEXAENOIC ACID PO Take by mouth.  . ferrous sulfate 324 MG TBEC Take 324 mg by mouth.  . hydrochlorothiazide (MICROZIDE) 12.5 MG capsule  Take 12.5 mg by mouth daily. Hydrochlorothiazide 12.5 MG Capsule TAKE ONE CAPSULE BY MOUTH EVERY DAY  . ibuprofen (ADVIL,MOTRIN) 800 MG tablet   . levothyroxine (SYNTHROID, LEVOTHROID) 112 MCG tablet Take 112 mcg by mouth as directed. Levothyroxine Sodium 112 MCG Tablet TAKE 1 TABLET BY MOUTH EVERY DAY 6 Days a week. None on Sunday  . lidocaine (LIDODERM) 5 % 1 patch remove after 12 hours  . lisinopril (PRINIVIL,ZESTRIL) 10 MG tablet Take 10 mg by mouth daily. Lisinopril 10 MG Tablet TAKE 1 TABLET BY MOUTH EVERY DAY  . omeprazole (PRILOSEC) 40 MG capsule Take 40 mg by mouth daily. Omeprazole 40 MG Capsule Delayed Release 1 capsule Once a day  . TRAZODONE HCL PO Take by mouth. Trazodone HCl 50 MG Tablet 1-2 tablets at bedtime  . [DISCONTINUED] ALPRAZolam (XANAX) 0.25 MG tablet Take by mouth.  . [DISCONTINUED] B Complex Vitamins (B COMPLEX PO) Take by mouth.  .  [DISCONTINUED] cyclobenzaprine (FLEXERIL) 10 MG tablet Take 1 tablet (10 mg total) by mouth 2 (two) times daily as needed.  . [DISCONTINUED] cyclobenzaprine (FLEXERIL) 10 MG tablet Take 1 tablet (10 mg total) by mouth 2 (two) times daily as needed for muscle spasms.  . [DISCONTINUED] eszopiclone (LUNESTA) 2 MG TABS tablet   . [DISCONTINUED] FLUARIX QUADRIVALENT 0.5 ML injection   . [DISCONTINUED] meloxicam (MOBIC) 7.5 MG tablet Take 7.5 mg daily by mouth.  . [DISCONTINUED] methylPREDNISolone (MEDROL DOSEPAK) 4 MG TBPK tablet 6 day dose pack - take as directed  . [DISCONTINUED] ondansetron (ZOFRAN) 4 MG tablet Take 4 mg by mouth every 8 (eight) hours as needed for nausea or vomiting. Zofran 4 MG Tablet 1 tablets three times a day as needed for nausea   No facility-administered encounter medications on file as of 12/23/2020.    Allergies:  Allergies  Allergen Reactions  . Phenergan [Promethazine Hcl]     Phenergan 25 mg tabs: loopy: Side Effects    Family History: Family History  Problem Relation Age of Onset  . Heart attack Father   . CAD Father   . Thyroid disease Maternal Grandmother     Social History: Social History   Tobacco Use  . Smoking status: Former Games developer  . Smokeless tobacco: Never Used  Vaping Use  . Vaping Use: Never used  Substance Use Topics  . Alcohol use: No  . Drug use: Never   Social History   Social History Narrative   Right handed     Vital Signs:  BP (!) 144/79   Pulse 65   Ht 5\' 2"  (1.575 m)   Wt 143 lb 3.2 oz (65 kg)   SpO2 98%   BMI 26.19 kg/m   Neurological Exam: MENTAL STATUS including orientation to time, place, person, recent and remote memory, attention span and concentration, language, and fund of knowledge is normal.  Speech is not dysarthric.  CRANIAL NERVES: II:  No visual field defects.    III-IV-VI: Pupils equal round and reactive to light.  Normal conjugate, extra-ocular eye movements in all directions of gaze.  No  nystagmus.  No ptosis.   V:  Normal facial sensation.    VII:  Normal facial symmetry and movements.   VIII:  Normal hearing and vestibular function.   IX-X:  Normal palatal movement.   XI:  Normal shoulder shrug and head rotation.   XII:  Normal tongue strength and range of motion, no deviation or fasciculation.  MOTOR: Pain with light touch over the feet, worse with pressure  over the tarsal bones.  No atrophy, fasciculations or abnormal movements.  No pronator drift.   Upper Extremity:  Right  Left  Deltoid  5/5   5/5   Biceps  5/5   5/5   Triceps  5/5   5/5   Infraspinatus 5/5  5/5  Medial pectoralis 5/5  5/5  Wrist extensors  5/5   5/5   Wrist flexors  5/5   5/5   Finger extensors  5/5   5/5   Finger flexors  5/5   5/5   Dorsal interossei  5/5   5/5   Abductor pollicis  5/5   5/5   Tone (Ashworth scale)  0  0   Lower Extremity:  Right  Left  Hip flexors  5/5   5/5   Hip extensors  5/5   5/5   Adductor 5/5  5/5  Abductor 5/5  5/5  Knee flexors  5/5   5/5   Knee extensors  5/5   5/5   Dorsiflexors  5/5   5/5   Plantarflexors  5/5   5/5   Toe extensors  5/5   5/5   Toe flexors  5/5   5/5   Tone (Ashworth scale)  0  0   MSRs:  Right        Left                  brachioradialis 2+  2+  biceps 2+  2+  triceps 2+  2+  patellar 2+  2+  ankle jerk 2+  2+  Hoffman no  no  plantar response down  down   SENSORY:  Normal and symmetric perception of light touch, pinprick, vibration, and proprioception.  Romberg's sign absent.   COORDINATION/GAIT: Normal finger-to- nose-finger.  Gait appears mildly antalgic (barefoot).  Stressed and tandem gait intact.  IMPRESSION: Bilateral feet pain, chronic.  Neurological exam is entirely normal with intact distal strength, sensation, and reflexes.  Normal exam makes it difficult to localize symptoms to primary nerve pathology, such as neuropathy, with that said, exam can be less impressive in small fiber neuropathy.  I discussed getting  NCS/EMG of the legs to help localize symptoms, and she will contact the office, if she chooses to proceed.  In the meantime, she will try gabapentin 300mg  at bedtime for pain.   Thank you for allowing me to participate in patient's care.  If I can answer any additional questions, I would be pleased to do so.    Sincerely,    Doniel Maiello K. , DO

## 2020-12-23 ENCOUNTER — Encounter: Payer: Self-pay | Admitting: Neurology

## 2020-12-23 ENCOUNTER — Ambulatory Visit: Payer: 59 | Admitting: Neurology

## 2020-12-23 ENCOUNTER — Other Ambulatory Visit: Payer: Self-pay

## 2020-12-23 VITALS — BP 144/79 | HR 65 | Ht 62.0 in | Wt 143.2 lb

## 2020-12-23 DIAGNOSIS — M79672 Pain in left foot: Secondary | ICD-10-CM | POA: Diagnosis not present

## 2020-12-23 DIAGNOSIS — G8929 Other chronic pain: Secondary | ICD-10-CM | POA: Diagnosis not present

## 2020-12-23 DIAGNOSIS — M79671 Pain in right foot: Secondary | ICD-10-CM | POA: Diagnosis not present

## 2020-12-23 MED ORDER — GABAPENTIN 300 MG PO CAPS
300.0000 mg | ORAL_CAPSULE | Freq: Three times a day (TID) | ORAL | 3 refills | Status: AC
Start: 2020-12-23 — End: ?

## 2020-12-23 NOTE — Patient Instructions (Addendum)
Start gabapentin 300mg  at bedtime  Contact my office if you would like to do the nerve testing

## 2021-02-26 ENCOUNTER — Emergency Department (HOSPITAL_BASED_OUTPATIENT_CLINIC_OR_DEPARTMENT_OTHER): Payer: 59

## 2021-02-26 ENCOUNTER — Emergency Department (HOSPITAL_BASED_OUTPATIENT_CLINIC_OR_DEPARTMENT_OTHER)
Admission: EM | Admit: 2021-02-26 | Discharge: 2021-02-26 | Disposition: A | Discharge status: Home / Self Care | Payer: 59 | Attending: Emergency Medicine | Admitting: Emergency Medicine

## 2021-02-26 ENCOUNTER — Encounter (HOSPITAL_BASED_OUTPATIENT_CLINIC_OR_DEPARTMENT_OTHER): Payer: Self-pay | Admitting: Emergency Medicine

## 2021-02-26 ENCOUNTER — Other Ambulatory Visit: Payer: Self-pay

## 2021-02-26 DIAGNOSIS — X501XXA Overexertion from prolonged static or awkward postures, initial encounter: Secondary | ICD-10-CM | POA: Diagnosis not present

## 2021-02-26 DIAGNOSIS — I10 Essential (primary) hypertension: Secondary | ICD-10-CM | POA: Insufficient documentation

## 2021-02-26 DIAGNOSIS — Z87891 Personal history of nicotine dependence: Secondary | ICD-10-CM | POA: Diagnosis not present

## 2021-02-26 DIAGNOSIS — Z79899 Other long term (current) drug therapy: Secondary | ICD-10-CM | POA: Insufficient documentation

## 2021-02-26 DIAGNOSIS — E039 Hypothyroidism, unspecified: Secondary | ICD-10-CM | POA: Insufficient documentation

## 2021-02-26 DIAGNOSIS — S93421A Sprain of deltoid ligament of right ankle, initial encounter: Secondary | ICD-10-CM | POA: Insufficient documentation

## 2021-02-26 DIAGNOSIS — Y9301 Activity, walking, marching and hiking: Secondary | ICD-10-CM | POA: Insufficient documentation

## 2021-02-26 DIAGNOSIS — S99911A Unspecified injury of right ankle, initial encounter: Secondary | ICD-10-CM | POA: Diagnosis present

## 2021-02-26 MED ORDER — IBUPROFEN 400 MG PO TABS
400.0000 mg | ORAL_TABLET | Freq: Once | ORAL | Status: AC
  Administered 2021-02-26: 400 mg via ORAL
  Filled 2021-02-26: qty 1

## 2021-02-26 NOTE — ED Notes (Signed)
Pt states was walking and stepped off curb, rolling L ankle. States has been able to put little pressure on it w/ pain accompanying. Pulses and sensations intact. All jewelry removed from leg & ice pack applied to affected area. Pt sitting in wheelchair, waiting to be transported to xray.

## 2021-02-26 NOTE — ED Provider Notes (Signed)
[facility] EMERGENCY DEPARTMENT Provider Note   CSN: [id number] Arrival date & time: 02/26/21  2018     History Chief Complaint  Patient presents with  . Ankle Pain    Jessica Valencia is a 60 y.o. female.  The history is provided by the patient.  Ankle Pain  Jessica Valencia is a 60 y.o. female who presents to the Emergency Department complaining of ankle pain. She states that she was walking off a curb and Ms. stopped and hurt her left ankle. She is unsure what exactly happened. She did catch herself before falling completely. She complains of pain to the lateral ankle, mid foot and lateral foot. No prior similar symptoms. She has worsening pain nonweightbearing. Symptoms happen shortly prior to ED arrival.    Past Medical History:  Diagnosis Date  . HTN (hypertension)   . Hypothyroid     Patient Active Problem List   Diagnosis Date Noted  . SUI (stress urinary incontinence, female) 12/28/2016    Past Surgical History:  Procedure Laterality Date  . CARPAL TUNNEL RELEASE  1999   Right Carpal Tunnel   . FINGER SURGERY  10/2010   Left trigger finger surgery Dr Betha Loa   . OOPHORECTOMY  1999   Right Ovary Removed      OB History   No obstetric history on file.     Family History  Problem Relation Age of Onset  . Heart attack Father   . CAD Father   . Thyroid disease Maternal Grandmother     Social History   Tobacco Use  . Smoking status: Former Games developer  . Smokeless tobacco: Never Used  Vaping Use  . Vaping Use: Never used  Substance Use Topics  . Alcohol use: No  . Drug use: Never    Home Medications Prior to Admission medications   Medication Sig Start Date End Date Taking? Authorizing Provider  ALPRAZolam Prudy Feeler) 0.25 MG tablet Take 0.25 mg by mouth at bedtime as needed for anxiety. Xanax 0.25 MG Tablet 1 tablet daily as needed    [provider]  DOCOSAHEXAENOIC ACID PO Take by mouth.    [provider]  ferrous  sulfate 324 MG TBEC Take 324 mg by mouth.    [provider]  gabapentin (NEURONTIN) 300 MG capsule Take 1 capsule (300 mg total) by mouth 3 (three) times daily. 12/23/20   Nita Sickle K, DO  hydrochlorothiazide (MICROZIDE) 12.5 MG capsule Take 12.5 mg by mouth daily. Hydrochlorothiazide 12.5 MG Capsule TAKE ONE CAPSULE BY MOUTH EVERY DAY    [provider]  ibuprofen (ADVIL,MOTRIN) 800 MG tablet  08/10/17   [provider]  levothyroxine (SYNTHROID, LEVOTHROID) 112 MCG tablet Take 112 mcg by mouth as directed. Levothyroxine Sodium 112 MCG Tablet TAKE 1 TABLET BY MOUTH EVERY DAY 6 Days a week. None on Sunday    [provider]  lidocaine (LIDODERM) 5 % 1 patch remove after 12 hours    [provider]  lisinopril (PRINIVIL,ZESTRIL) 10 MG tablet Take 10 mg by mouth daily. Lisinopril 10 MG Tablet TAKE 1 TABLET BY MOUTH EVERY DAY    [provider]  omeprazole (PRILOSEC) 40 MG capsule Take 40 mg by mouth daily. Omeprazole 40 MG Capsule Delayed Release 1 capsule Once a day    [provider]  TRAZODONE HCL PO Take by mouth. Trazodone HCl 50 MG Tablet 1-2 tablets at bedtime    [provider]    Allergies  Phenergan [promethazine hcl]  Review of Systems   Review of Systems  All other systems reviewed and are negative.   Physical Exam Updated Vital Signs BP 116/85   Pulse 89   Temp 98.2 F (36.8 C) (Oral)   Resp 16   Ht 5\' 2"  (1.575 m)   Wt 63.5 kg   SpO2 98%   BMI 25.61 kg/m   Physical Exam Vitals and nursing note reviewed.  Constitutional:      Appearance: She is well-developed.  HENT:     Head: Normocephalic and atraumatic.  Cardiovascular:     Rate and Rhythm: Normal rate and regular rhythm.  Pulmonary:     Effort: Pulmonary effort is normal. No respiratory distress.  Musculoskeletal:     Comments: 2+ left DP pulse. There is moderate soft tissue tenderness with mild swelling to the mid foot and lateral  left foot. There is moderate pain at the base at the fifth metatarsal. Wiggles toes  Skin:    General: Skin is warm and dry.  Neurological:     Mental Status: She is alert and oriented to person, place, and time.  Psychiatric:        Behavior: Behavior normal.     ED Results / Procedures / Treatments   Labs (all labs ordered are listed, but only abnormal results are displayed) Labs Reviewed - No data to display  EKG None  Radiology DG Ankle Complete Left  Result Date: 02/26/2021 CLINICAL DATA:  Stepped off curb with left ankle pain, initial encounter EXAM: LEFT ANKLE COMPLETE - 3+ VIEW COMPARISON:  None. FINDINGS: There is no evidence of fracture, dislocation, or joint effusion. No soft tissue abnormality is noted. Calcaneal spurring is seen. IMPRESSION: No acute abnormality noted. Electronically Signed   By: 04/28/2021 M.D.   On: 02/26/2021 21:52   DG Foot Complete Left  Result Date: 02/26/2021 CLINICAL DATA:  Stepped off curb wrong with foot pain, initial encounter EXAM: LEFT FOOT - COMPLETE 3+ VIEW COMPARISON:  None. FINDINGS: There is no evidence of fracture or dislocation. No soft tissue abnormality is noted. Calcaneal spurring is seen. IMPRESSION: No acute abnormality noted. Electronically Signed   By: 04/28/2021 M.D.   On: 02/26/2021 21:53    Procedures Procedures   Medications Ordered in ED Medications  ibuprofen (ADVIL) tablet 400 mg (400 mg Oral Given 02/26/21 2050)    ED Course  I have reviewed the triage vital signs and the nursing notes.  Pertinent labs & imaging results that were available during my care of the patient were reviewed by me and considered in my medical decision making (see chart for details).    MDM Rules/Calculators/A&P                         patient here for evaluation of left ankle and foot pain after miss-stepping off the curb. She has lateral foot tenderness to palpation. Imaging is negative for fracture. Discussed with patient home care  for ankle/foot sprain with outpatient follow-up and return precautions.  Final Clinical Impression(s) / ED Diagnoses Final diagnoses:  Sprain of deltoid ligament of right ankle, initial encounter    Rx / DC Orders ED Discharge Orders    None       04/28/21, MD 02/26/21 2308

## 2021-02-26 NOTE — ED Triage Notes (Signed)
Reports she stepped off the curb wrong twisting her left ankle tonight.  Reports difficulty bearing weight.

## 2021-07-07 ENCOUNTER — Other Ambulatory Visit (HOSPITAL_BASED_OUTPATIENT_CLINIC_OR_DEPARTMENT_OTHER): Payer: Self-pay | Admitting: Nurse Practitioner

## 2021-07-07 ENCOUNTER — Telehealth (HOSPITAL_BASED_OUTPATIENT_CLINIC_OR_DEPARTMENT_OTHER): Payer: Self-pay

## 2021-08-18 ENCOUNTER — Other Ambulatory Visit (HOSPITAL_BASED_OUTPATIENT_CLINIC_OR_DEPARTMENT_OTHER): Payer: Self-pay | Admitting: Nurse Practitioner

## 2021-08-18 DIAGNOSIS — Z1231 Encounter for screening mammogram for malignant neoplasm of breast: Secondary | ICD-10-CM

## 2021-08-23 ENCOUNTER — Encounter (HOSPITAL_BASED_OUTPATIENT_CLINIC_OR_DEPARTMENT_OTHER): Payer: Self-pay | Admitting: Radiology

## 2021-08-23 ENCOUNTER — Ambulatory Visit (HOSPITAL_BASED_OUTPATIENT_CLINIC_OR_DEPARTMENT_OTHER)
Admission: RE | Admit: 2021-08-23 | Discharge: 2021-08-23 | Disposition: A | Discharge status: Home / Self Care | Payer: 59 | Source: Ambulatory Visit | Attending: Nurse Practitioner | Admitting: Nurse Practitioner

## 2021-08-23 ENCOUNTER — Other Ambulatory Visit: Payer: Self-pay

## 2021-08-23 DIAGNOSIS — Z1231 Encounter for screening mammogram for malignant neoplasm of breast: Secondary | ICD-10-CM | POA: Diagnosis present

## 2023-02-05 ENCOUNTER — Encounter: Payer: Self-pay | Admitting: *Deleted

## 2024-04-22 ENCOUNTER — Encounter (HOSPITAL_BASED_OUTPATIENT_CLINIC_OR_DEPARTMENT_OTHER): Payer: Self-pay

## 2024-04-22 ENCOUNTER — Emergency Department (HOSPITAL_BASED_OUTPATIENT_CLINIC_OR_DEPARTMENT_OTHER)
Admission: EM | Admit: 2024-04-22 | Discharge: 2024-04-22 | Disposition: A | Discharge status: Home / Self Care | Attending: Emergency Medicine | Admitting: Emergency Medicine

## 2024-04-22 ENCOUNTER — Other Ambulatory Visit: Payer: Self-pay

## 2024-04-22 ENCOUNTER — Emergency Department (HOSPITAL_BASED_OUTPATIENT_CLINIC_OR_DEPARTMENT_OTHER)

## 2024-04-22 DIAGNOSIS — Z79899 Other long term (current) drug therapy: Secondary | ICD-10-CM | POA: Insufficient documentation

## 2024-04-22 DIAGNOSIS — I10 Essential (primary) hypertension: Secondary | ICD-10-CM | POA: Insufficient documentation

## 2024-04-22 DIAGNOSIS — R1032 Left lower quadrant pain: Secondary | ICD-10-CM | POA: Diagnosis present

## 2024-04-22 DIAGNOSIS — N132 Hydronephrosis with renal and ureteral calculous obstruction: Secondary | ICD-10-CM | POA: Diagnosis not present

## 2024-04-22 DIAGNOSIS — E039 Hypothyroidism, unspecified: Secondary | ICD-10-CM | POA: Diagnosis not present

## 2024-04-22 DIAGNOSIS — N200 Calculus of kidney: Secondary | ICD-10-CM

## 2024-04-22 LAB — URINALYSIS, ROUTINE W REFLEX MICROSCOPIC
Bilirubin Urine: NEGATIVE
Glucose, UA: NEGATIVE mg/dL
Ketones, ur: NEGATIVE mg/dL
Leukocytes,Ua: NEGATIVE
Nitrite: NEGATIVE
Protein, ur: 30 mg/dL — AB
Specific Gravity, Urine: >=1.03 (ref 1.005–1.030)
pH: 6 (ref 5.0–8.0)

## 2024-04-22 LAB — CBC WITH DIFFERENTIAL/PLATELET
Abs Immature Granulocytes: 0.01 10*3/uL (ref 0.00–0.07)
Basophils Absolute: 0.1 10*3/uL (ref 0.0–0.1)
Basophils Relative: 1 %
Eosinophils Absolute: 0.2 10*3/uL (ref 0.0–0.5)
Eosinophils Relative: 3 %
HCT: 40.3 % (ref 36.0–46.0)
Hemoglobin: 13.9 g/dL (ref 12.0–15.0)
Immature Granulocytes: 0 %
Lymphocytes Relative: 25 %
Lymphs Abs: 1.8 10*3/uL (ref 0.7–4.0)
MCH: 27.9 pg (ref 26.0–34.0)
MCHC: 34.5 g/dL (ref 30.0–36.0)
MCV: 80.9 fL (ref 80.0–100.0)
Monocytes Absolute: 0.5 10*3/uL (ref 0.1–1.0)
Monocytes Relative: 8 %
Neutro Abs: 4.4 10*3/uL (ref 1.7–7.7)
Neutrophils Relative %: 63 %
Platelets: 220 10*3/uL (ref 150–400)
RBC: 4.98 MIL/uL (ref 3.87–5.11)
RDW: 13.1 % (ref 11.5–15.5)
WBC: 7 10*3/uL (ref 4.0–10.5)
nRBC: 0 % (ref 0.0–0.2)

## 2024-04-22 LAB — COMPREHENSIVE METABOLIC PANEL WITH GFR
ALT: 35 U/L (ref 0–44)
AST: 26 U/L (ref 15–41)
Albumin: 4.6 g/dL (ref 3.5–5.0)
Alkaline Phosphatase: 90 U/L (ref 38–126)
Anion gap: 12 (ref 5–15)
BUN: 20 mg/dL (ref 8–23)
CO2: 27 mmol/L (ref 22–32)
Calcium: 9.7 mg/dL (ref 8.9–10.3)
Chloride: 103 mmol/L (ref 98–111)
Creatinine, Ser: 0.86 mg/dL (ref 0.44–1.00)
GFR, Estimated: >60 mL/min (ref >60)
Glucose, Bld: 118 mg/dL — ABNORMAL HIGH (ref 70–99)
Potassium: 3.6 mmol/L (ref 3.5–5.1)
Sodium: 142 mmol/L (ref 135–145)
Total Bilirubin: 0.7 mg/dL (ref 0.0–1.2)
Total Protein: 6.9 g/dL (ref 6.5–8.1)

## 2024-04-22 LAB — URINALYSIS, MICROSCOPIC (REFLEX): RBC / HPF: >50 RBC/hpf (ref 0–5)

## 2024-04-22 MED ORDER — TAMSULOSIN HCL 0.4 MG PO CAPS
0.4000 mg | ORAL_CAPSULE | Freq: Once | ORAL | Status: AC
  Administered 2024-04-22: 0.4 mg via ORAL
  Filled 2024-04-22: qty 1

## 2024-04-22 MED ORDER — IOHEXOL 300 MG/ML  SOLN
100.0000 mL | Freq: Once | INTRAMUSCULAR | Status: AC | PRN
  Administered 2024-04-22: 100 mL via INTRAVENOUS

## 2024-04-22 MED ORDER — ONDANSETRON 4 MG PO TBDP: 4.0000 mg | ORAL_TABLET | Freq: Three times a day (TID) | ORAL | 0 refills | Status: AC | PRN

## 2024-04-22 MED ORDER — OXYCODONE-ACETAMINOPHEN 5-325 MG PO TABS: 1.0000 | ORAL_TABLET | Freq: Four times a day (QID) | ORAL | 0 refills | Status: AC | PRN

## 2024-04-22 MED ORDER — FENTANYL CITRATE PF 50 MCG/ML IJ SOSY
50.0000 ug | PREFILLED_SYRINGE | Freq: Once | INTRAMUSCULAR | Status: AC
  Administered 2024-04-22: 50 ug via INTRAVENOUS
  Filled 2024-04-22: qty 1

## 2024-04-22 MED ORDER — ONDANSETRON 4 MG PO TBDP
8.0000 mg | ORAL_TABLET | Freq: Once | ORAL | Status: AC
Start: 2024-04-22 — End: 2024-04-22
  Administered 2024-04-22: 8 mg via ORAL
  Filled 2024-04-22: qty 2

## 2024-04-22 MED ORDER — TAMSULOSIN HCL 0.4 MG PO CAPS: 0.4000 mg | ORAL_CAPSULE | Freq: Every day | ORAL | 0 refills | Status: AC

## 2024-04-22 NOTE — ED Provider Notes (Signed)
 [place] [facility] AT [facility] Provider Note   CSN: [id number] Arrival date & time: 04/22/24  1807     Patient presents with: Abdominal Pain   Jessica Valencia is a 63 y.o. female past medical history significant for hypothyroidism and hypertension presents today for left lower abdominal pain that began around 230 today.  Patient endorses nausea, vomiting, and 2 episodes of diarrhea this morning.  Patient denies hematemesis, hematochezia, melena, dysuria, hematuria, frequency, urgency, chest pain, or shortness of breath.  [facility] today and sent here for CT scan.    Abdominal Pain Associated symptoms: diarrhea, nausea and vomiting        Prior to Admission medications   Medication Sig Start Date End Date Taking? Authorizing Provider  ondansetron  (ZOFRAN -ODT) 4 MG disintegrating tablet Take 1 tablet (4 mg total) by mouth every 8 (eight) hours as needed. 04/22/24  Yes Jacquelynne Guedes N, PA-C  oxyCODONE -acetaminophen  (PERCOCET/ROXICET) 5-325 MG tablet Take 1 tablet by mouth every 6 (six) hours as needed for up to 5 days for severe pain (pain score 7-10). 04/22/24 04/27/24 Yes Francis Ileana SAILOR, PA-C  tamsulosin  (FLOMAX ) 0.4 MG CAPS capsule Take 1 capsule (0.4 mg total) by mouth daily. 04/22/24  Yes Doniel Maiello N, PA-C  ALPRAZolam (XANAX) 0.25 MG tablet Take 0.25 mg by mouth at bedtime as needed for anxiety. Xanax 0.25 MG Tablet 1 tablet daily as needed    [provider]  DOCOSAHEXAENOIC ACID PO Take by mouth.    [provider]  ferrous sulfate 324 MG TBEC Take 324 mg by mouth.    [provider]  gabapentin  (NEURONTIN ) 300 MG capsule Take 1 capsule (300 mg total) by mouth 3 (three) times daily. 12/23/20   Patel, Donika K, DO  hydrochlorothiazide (MICROZIDE) 12.5 MG capsule Take 12.5 mg by mouth daily. Hydrochlorothiazide 12.5 MG Capsule TAKE ONE CAPSULE BY MOUTH EVERY DAY    [provider]  ibuprofen   (ADVIL ,MOTRIN ) 800 MG tablet  08/10/17   [provider]  levothyroxine (SYNTHROID, LEVOTHROID) 112 MCG tablet Take 112 mcg by mouth as directed. Levothyroxine Sodium 112 MCG Tablet TAKE 1 TABLET BY MOUTH EVERY DAY 6 Days a week. None on Sunday    [provider]  lidocaine (LIDODERM) 5 % 1 patch remove after 12 hours    [provider]  lisinopril (PRINIVIL,ZESTRIL) 10 MG tablet Take 10 mg by mouth daily. Lisinopril 10 MG Tablet TAKE 1 TABLET BY MOUTH EVERY DAY    [provider]  omeprazole (PRILOSEC) 40 MG capsule Take 40 mg by mouth daily. Omeprazole 40 MG Capsule Delayed Release 1 capsule Once a day    [provider]  TRAZODONE HCL PO Take by mouth. Trazodone HCl 50 MG Tablet 1-2 tablets at bedtime    [provider]    Allergies: Phenergan [promethazine hcl]    Review of Systems  Gastrointestinal:  Positive for abdominal pain, diarrhea, nausea and vomiting.  Genitourinary:  Positive for flank pain.    Updated Vital Signs BP 128/79   Pulse 75   Temp 98 F (36.7 C) (Oral)   Resp 17   Ht 5' 2 (1.575 m)   Wt 72.1 kg   SpO2 98%   BMI 29.08 kg/m   Physical Exam Vitals and nursing note reviewed.  Constitutional:      General: She is not in acute distress.    Appearance: She is well-developed. She is not ill-appearing or  diaphoretic.  HENT:     Head: Normocephalic and atraumatic.   Eyes:     Conjunctiva/sclera: Conjunctivae normal.    Cardiovascular:     Rate and Rhythm: Normal rate and regular rhythm.     Heart sounds: Normal heart sounds. No murmur heard. Pulmonary:     Effort: Pulmonary effort is normal. No respiratory distress.     Breath sounds: Normal breath sounds.  Abdominal:     General: Abdomen is flat. There is no distension.     Palpations: Abdomen is soft.     Tenderness: There is abdominal tenderness in the left lower quadrant. There is left CVA tenderness. There is no right CVA tenderness or guarding.  Negative signs include Murphy's sign, Rovsing's sign and McBurney's sign.   Musculoskeletal:        General: No swelling.     Cervical back: Neck supple.   Skin:    General: Skin is warm and dry.     Capillary Refill: Capillary refill takes less than 2 seconds.   Neurological:     General: No focal deficit present.     Mental Status: She is alert and oriented to person, place, and time.   Psychiatric:        Mood and Affect: Mood normal.     (all labs ordered are listed, but only abnormal results are displayed) Labs Reviewed  URINALYSIS, ROUTINE W REFLEX MICROSCOPIC - Abnormal; Notable for the following components:      Result Value   APPearance HAZY (*)    Hgb urine dipstick LARGE (*)    Protein, ur 30 (*)    All other components within normal limits  COMPREHENSIVE METABOLIC PANEL WITH GFR - Abnormal; Notable for the following components:   Glucose, Bld 118 (*)    All other components within normal limits  URINALYSIS, MICROSCOPIC (REFLEX) - Abnormal; Notable for the following components:   Bacteria, UA RARE (*)    All other components within normal limits  CBC WITH DIFFERENTIAL/PLATELET    EKG: None  Radiology: CT ABDOMEN PELVIS W CONTRAST Result Date: 04/22/2024 CLINICAL DATA:  Left lower quadrant abdominal pain. Nausea and diarrhea. History of kidney stone. EXAM: CT ABDOMEN AND PELVIS WITH CONTRAST TECHNIQUE: Multidetector CT imaging of the abdomen and pelvis was performed using the standard protocol following bolus administration of intravenous contrast. RADIATION DOSE REDUCTION: This exam was performed according to the departmental dose-optimization program which includes automated exposure control, adjustment of the mA and/or kV according to patient size and/or use of iterative reconstruction technique. CONTRAST:  OMNIPAQUE  IOHEXOL  300 MG/ML  SOLN COMPARISON:  None are available FINDINGS: Lower chest: No acute abnormality. Hepatobiliary: Unremarkable liver. Normal  gallbladder. No biliary dilation. Pancreas: Unremarkable. Spleen: Unremarkable. Adrenals/Urinary Tract: Normal adrenal glands. Mild left hydro ureteral nephrosis upstream from a 3 mm stone in the proximal left ureter. Delayed left nephrogram. No right hydronephrosis. Bladder is unremarkable. Stomach/Bowel: Normal caliber large and small bowel. No bowel wall thickening. The appendix is normal.Stomach is within normal limits. Vascular/Lymphatic: Aortic atherosclerosis. No enlarged abdominal or pelvic lymph nodes. Reproductive: Fibroid uterus.  No adnexal mass. Other: No free intraperitoneal fluid or air. Musculoskeletal: No acute fracture. IMPRESSION: 1. Mild left hydroureteronephrosis upstream from a 3 mm stone in the proximal left ureter. Aortic Atherosclerosis (ICD10-I70.0). Electronically Signed   By: Norman Gatlin M.D.   On: 04/22/2024 20:31     Procedures   Medications Ordered in the ED  tamsulosin  (FLOMAX ) capsule 0.4 mg (has no administration  in time range)  ondansetron  (ZOFRAN -ODT) disintegrating tablet 8 mg (has no administration in time range)  fentaNYL  (SUBLIMAZE ) injection 50 mcg (has no administration in time range)  fentaNYL  (SUBLIMAZE ) injection 50 mcg (50 mcg Intravenous Given 04/22/24 1911)  fentaNYL  (SUBLIMAZE ) injection 50 mcg (50 mcg Intravenous Given 04/22/24 1931)  iohexol  (OMNIPAQUE ) 300 MG/ML solution 100 mL (100 mLs Intravenous Contrast Given 04/22/24 1953)                                    Medical Decision Making Amount and/or Complexity of Data Reviewed Labs: ordered. Radiology: ordered.  Risk Prescription drug management.   This patient presents to the ED for concern of flank pain with nausea, vomiting, diarrhea differential diagnosis includes kidney stone, UTI, pyelonephritis, diverticulitis, ulcerative colitis, appendicitis, pancreatitis, choledocholithiasis, acute cholecystitis, viral GI illness   Additional history obtained   Additional history obtained  from Electronic Medical Record External records from outside source obtained and reviewed including Care Everywhere   Lab Tests:  I Ordered, and personally interpreted labs.  The pertinent results include: CBC WNL, UA with large hemoglobin, greater than 50 RBCs, elevated specific gravity, 30 protein   Imaging Studies ordered:  I ordered imaging studies including CT abdomen pelvis with contrast I independently visualized and interpreted imaging which showed mild left hydroureternephrosis upstream from a 3 mm stone in the proximal left ureter I agree with the radiologist interpretation   Medicines ordered and prescription drug management:  I ordered medication including Flomax     I have reviewed the patients home medicines and have made adjustments as needed   Problem List / ED Course:  Consider for admission or further workup however patient's vital signs, physical exam, labs, and imaging of been reassuring.  Patient's symptoms likely due to left-sided kidney stone.  Patient given outpatient course of Flomax , analgesia, and antiemetics.  Patient given return precautions.  I feel patient is safer discharge at this time.       Final diagnoses:  Kidney stone    ED Discharge Orders          Ordered    oxyCODONE -acetaminophen  (PERCOCET/ROXICET) 5-325 MG tablet  Every 6 hours PRN        04/22/24 2054    tamsulosin  (FLOMAX ) 0.4 MG CAPS capsule  Daily        04/22/24 2054    ondansetron  (ZOFRAN -ODT) 4 MG disintegrating tablet  Every 8 hours PRN        04/22/24 2054               Francis Ileana SAILOR, PA-C 04/22/24 2055    Yolande Lamar BROCKS, MD 04/25/24 1434

## 2024-04-22 NOTE — Discharge Instructions (Addendum)
 Today you are seen for a kidney stone.  Please pick up your medications and take as prescribed.  Please return to the ED if you have worsening pain, uncontrollable fever or uncontrollable vomiting.  Thank you for letting us  treat you today. After reviewing your labs and imaging, I feel you are safe to go home. Please follow up with your PCP in the next several days and provide them with your records from this visit. Return to the Emergency Room if pain becomes severe or symptoms worsen.

## 2024-04-22 NOTE — ED Notes (Signed)
Pt. To CT scanner

## 2024-04-22 NOTE — ED Triage Notes (Addendum)
 Pt states she began having Left lower abdominal pain since 1430 +nausea and diarrhea  Denies dysuria Hx of kidney stones Went to [facility] earlier receive a shot, Zofran ODT and referred her her for CT scan

## 2024-08-11 ENCOUNTER — Other Ambulatory Visit (HOSPITAL_BASED_OUTPATIENT_CLINIC_OR_DEPARTMENT_OTHER): Payer: Self-pay | Admitting: Physician Assistant

## 2024-08-11 DIAGNOSIS — Z139 Encounter for screening, unspecified: Secondary | ICD-10-CM

## 2024-08-14 ENCOUNTER — Telehealth (HOSPITAL_BASED_OUTPATIENT_CLINIC_OR_DEPARTMENT_OTHER): Payer: Self-pay

## 2024-08-28 ENCOUNTER — Ambulatory Visit (HOSPITAL_BASED_OUTPATIENT_CLINIC_OR_DEPARTMENT_OTHER)
Admission: RE | Admit: 2024-08-28 | Discharge: 2024-08-28 | Disposition: A | Discharge status: Home / Self Care | Source: Ambulatory Visit | Attending: Physician Assistant | Admitting: Physician Assistant

## 2024-08-28 ENCOUNTER — Encounter (HOSPITAL_BASED_OUTPATIENT_CLINIC_OR_DEPARTMENT_OTHER): Payer: Self-pay

## 2024-08-28 DIAGNOSIS — Z139 Encounter for screening, unspecified: Secondary | ICD-10-CM

## 2024-08-28 DIAGNOSIS — Z1231 Encounter for screening mammogram for malignant neoplasm of breast: Secondary | ICD-10-CM | POA: Insufficient documentation

## 2024-09-02 ENCOUNTER — Other Ambulatory Visit: Payer: Self-pay | Admitting: Physician Assistant

## 2024-09-02 DIAGNOSIS — R928 Other abnormal and inconclusive findings on diagnostic imaging of breast: Secondary | ICD-10-CM

## 2024-09-16 ENCOUNTER — Ambulatory Visit
Admission: RE | Admit: 2024-09-16 | Discharge: 2024-09-16 | Disposition: A | Discharge status: Home / Self Care | Source: Ambulatory Visit | Attending: Physician Assistant | Admitting: Physician Assistant

## 2024-09-16 DIAGNOSIS — R928 Other abnormal and inconclusive findings on diagnostic imaging of breast: Secondary | ICD-10-CM
